# Patient Record
Sex: Female | Born: 1975 | Race: Black or African American | Hispanic: No | Marital: Single | Smoking: Former smoker
Health system: Southern US, Community
[De-identification: ages and names within clinical notes are randomized; demographics above are authoritative.]

## PROBLEM LIST (undated history)

## (undated) DIAGNOSIS — Z789 Other specified health status: Secondary | ICD-10-CM

## (undated) HISTORY — PX: TUBAL LIGATION: SHX77

---

## 1997-09-09 ENCOUNTER — Inpatient Hospital Stay (HOSPITAL_COMMUNITY): Admission: AD | Admit: 1997-09-09 | Discharge: 1997-09-13 | Payer: Self-pay | Admitting: Obstetrics & Gynecology

## 1998-09-14 ENCOUNTER — Emergency Department (HOSPITAL_COMMUNITY): Admission: EM | Admit: 1998-09-14 | Discharge: 1998-09-14 | Payer: Self-pay | Admitting: Emergency Medicine

## 1999-06-20 ENCOUNTER — Emergency Department (HOSPITAL_COMMUNITY): Admission: EM | Admit: 1999-06-20 | Discharge: 1999-06-20 | Payer: Self-pay | Admitting: Emergency Medicine

## 1999-09-05 ENCOUNTER — Encounter: Payer: Self-pay | Admitting: Emergency Medicine

## 1999-09-05 ENCOUNTER — Emergency Department (HOSPITAL_COMMUNITY): Admission: EM | Admit: 1999-09-05 | Discharge: 1999-09-05 | Payer: Self-pay | Admitting: Emergency Medicine

## 1999-10-18 ENCOUNTER — Ambulatory Visit (HOSPITAL_COMMUNITY): Admission: RE | Admit: 1999-10-18 | Discharge: 1999-10-18 | Payer: Self-pay | Admitting: Obstetrics & Gynecology

## 1999-12-29 ENCOUNTER — Inpatient Hospital Stay (HOSPITAL_COMMUNITY): Admission: AD | Admit: 1999-12-29 | Discharge: 1999-12-31 | Payer: Self-pay | Admitting: *Deleted

## 1999-12-29 ENCOUNTER — Encounter (INDEPENDENT_AMBULATORY_CARE_PROVIDER_SITE_OTHER): Payer: Self-pay

## 2000-08-10 ENCOUNTER — Emergency Department (HOSPITAL_COMMUNITY): Admission: EM | Admit: 2000-08-10 | Discharge: 2000-08-10 | Payer: Self-pay | Admitting: Internal Medicine

## 2001-05-31 ENCOUNTER — Emergency Department (HOSPITAL_COMMUNITY): Admission: EM | Admit: 2001-05-31 | Discharge: 2001-05-31 | Payer: Self-pay | Admitting: Emergency Medicine

## 2004-03-10 ENCOUNTER — Emergency Department (HOSPITAL_COMMUNITY): Admission: EM | Admit: 2004-03-10 | Discharge: 2004-03-10 | Payer: Self-pay | Admitting: Emergency Medicine

## 2004-12-24 ENCOUNTER — Emergency Department (HOSPITAL_COMMUNITY): Admission: EM | Admit: 2004-12-24 | Discharge: 2004-12-24 | Payer: Self-pay | Admitting: Emergency Medicine

## 2008-10-02 ENCOUNTER — Emergency Department (HOSPITAL_COMMUNITY): Admission: EM | Admit: 2008-10-02 | Discharge: 2008-10-02 | Payer: Self-pay | Admitting: Family Medicine

## 2010-11-16 NOTE — Op Note (Signed)
[facility]  Patient:    Krystal Nixon, Krystal Nixon                        MRN: [id number] Proc. Date: 12/30/99 Adm. Date:  [id number] Attending:  Michaelle Copas                           Operative Report  PREOPERATIVE DIAGNOSIS:           Desires sterilization.  POSTOPERATIVE DIAGNOSIS:          Desires sterilization.  PROCEDURE:                        Bilateral partial salpingectomy (Pomeroy technique).  SURGEON:                          Charles A. Clearance Coots, M.D.  ANESTHESIA:                       Spinal.  ESTIMATED BLOOD LOSS:             Negligible.  COMPLICATIONS:                    None.  SPECIMEN:                         Approximately 2 cm segments of right and left fallopian tubes.  DESCRIPTION OF OPERATION:         The patient was brought to the operating room, and after satisfactory spinal anesthesia the abdomen was prepped and draped in the usual sterile fashion.  A small inferior umbilical incision was made with the scalpel.  It was deepened down to the fascia with curved Mayo scissors.  The fascia was grasped in the midline with two Kelly forceps, cut transversely with curved Mayo scissors down through the peritoneum.  The incision was then extended to the left and then to right with curved Mayo scissors.  Right angle retractors were placed in the incision, and the right fallopian tube was identified and grasped with the Babcock clamp.  The tube was followed from the conial end to the fimbrial end serially, and then grasped with Babcock clamps.  The tube was then regrasped in the the isthmic area of the tube with the Babcock clamp, and a knuckle of tube beneath the Babcock clamp was doubly ligated with #1 plain cat gut.  The section of tube was excised with Metzenbaum scissors above the knot; submitted to pathology for examination.  There was no active bleeding from the tubal stumps; they were therefore placed back in the normal  anatomic position.                                    The same procedure was performed on the opposite side without complications.  The abdomen was then closed as follows.                                    The peritoneum and fascia was closed as one with a continuous suture of 2-0 Vicryl.  Skin was closed with continuous subcuticular suture of 4-0 Monocryl.  Sterile bandage was  applied to the incision closure.  The surgical technician indicated that all needles, sponge and instrument counts were correct.  The patient tolerated the procedure well and was transported to the recovery room in satisfactory condition. DD:  12/30/99 TD:  12/31/99 Job: [id number]

## 2011-04-23 ENCOUNTER — Emergency Department (HOSPITAL_COMMUNITY)
Admission: EM | Admit: 2011-04-23 | Discharge: 2011-04-23 | Disposition: A | Discharge status: Home / Self Care | Payer: Self-pay | Attending: Emergency Medicine | Admitting: Emergency Medicine

## 2011-04-23 DIAGNOSIS — M25559 Pain in unspecified hip: Secondary | ICD-10-CM | POA: Insufficient documentation

## 2011-04-23 DIAGNOSIS — M79609 Pain in unspecified limb: Secondary | ICD-10-CM | POA: Insufficient documentation

## 2011-04-23 DIAGNOSIS — IMO0001 Reserved for inherently not codable concepts without codable children: Secondary | ICD-10-CM | POA: Insufficient documentation

## 2011-04-23 DIAGNOSIS — M5430 Sciatica, unspecified side: Secondary | ICD-10-CM | POA: Insufficient documentation

## 2013-08-07 ENCOUNTER — Inpatient Hospital Stay (HOSPITAL_COMMUNITY)
Admission: AD | Admit: 2013-08-07 | Discharge: 2013-08-07 | Disposition: A | Discharge status: Home / Self Care | Payer: Self-pay | Source: Ambulatory Visit | Attending: Obstetrics & Gynecology | Admitting: Obstetrics & Gynecology

## 2013-08-07 ENCOUNTER — Encounter (HOSPITAL_COMMUNITY): Payer: Self-pay | Admitting: *Deleted

## 2013-08-07 DIAGNOSIS — A5901 Trichomonal vulvovaginitis: Secondary | ICD-10-CM

## 2013-08-07 LAB — WET PREP, GENITAL
Clue Cells Wet Prep HPF POC: NONE SEEN
Yeast Wet Prep HPF POC: NONE SEEN

## 2013-08-07 LAB — URINALYSIS, ROUTINE W REFLEX MICROSCOPIC
Bilirubin Urine: NEGATIVE
Glucose, UA: NEGATIVE mg/dL
Ketones, ur: NEGATIVE mg/dL
Nitrite: NEGATIVE
Protein, ur: NEGATIVE mg/dL
Specific Gravity, Urine: 1.025 (ref 1.005–1.030)
Urobilinogen, UA: 0.2 mg/dL (ref 0.0–1.0)
pH: 5.5 (ref 5.0–8.0)

## 2013-08-07 LAB — URINE MICROSCOPIC-ADD ON

## 2013-08-07 LAB — POCT PREGNANCY, URINE: Preg Test, Ur: NEGATIVE

## 2013-08-07 MED ORDER — METRONIDAZOLE 500 MG PO TABS
2000.0000 mg | ORAL_TABLET | Freq: Once | ORAL | Status: AC
  Administered 2013-08-07: 2000 mg via ORAL
  Filled 2013-08-07: qty 4

## 2013-08-07 NOTE — Discharge Instructions (Signed)
Trichomoniasis Trichomoniasis is an infection, caused by the Trichomonas organism, that affects both women and men. In women, the outer female genitalia and the vagina are affected. In men, the penis is mainly affected, but the prostate and other reproductive organs can also be involved. Trichomoniasis is a sexually transmitted disease (STD) and is most often passed to another person through sexual contact. The majority of people who get trichomoniasis do so from a sexual encounter and are also at risk for other STDs. CAUSES   Sexual intercourse with an infected partner.  It can be present in swimming pools or hot tubs. SYMPTOMS   Abnormal gray-green frothy vaginal discharge in women.  Vaginal itching and irritation in women.  Itching and irritation of the area outside the vagina in women.  Penile discharge with or without pain in males.  Inflammation of the urethra (urethritis), causing painful urination.  Bleeding after sexual intercourse. RELATED COMPLICATIONS  Pelvic inflammatory disease.  Infection of the uterus (endometritis).  Infertility.  Tubal (ectopic) pregnancy.  It can be associated with other STDs, including gonorrhea and chlamydia, hepatitis B, and HIV. COMPLICATIONS DURING PREGNANCY  Early (premature) delivery.  Premature rupture of the membranes (PROM).  Low birth weight. DIAGNOSIS   Visualization of Trichomonas under the microscope from the vagina discharge.  Ph of the vagina greater than 4.5, tested with a test tape.  Trich Rapid Test.  Culture of the organism, but this is not usually needed.  It may be found on a Pap test.  Having a "strawberry cervix,"which means the cervix looks very red like a strawberry. TREATMENT   You may be given medication to fight the infection. Inform your caregiver if you could be or are pregnant. Some medications used to treat the infection should not be taken during pregnancy.  Over-the-counter medications or  creams to decrease itching or irritation may be recommended. Your sexual partner will need to be treated HOME CARE INSTRUCTIONS   Take all medication prescribed by your caregiver.  Take over-the-counter medication for itching or irritation as directed by your caregiver.  Do not have sexual intercourse while you have the infection.  Do not douche or wear tampons.  Discuss your infection with your partner, as your partner may have acquired the infection from you. Or, your partner may have been the person who transmitted the infection to you.  Have your sex partner examined and treated if necessary.  Practice safe, informed, and protected sex.  See your caregiver for other STD testing. SEEK MEDICAL CARE IF:   You still have symptoms after you finish the medication.  You have an oral temperature above 102 F (38.9 C).  You develop belly (abdominal) pain.  You have pain when you urinate.  You have bleeding after sexual intercourse.  You develop a rash.  The medication makes you sick or makes you throw up (vomit). Document Released: 12/11/2000 Document Revised: 09/09/2011 Document Reviewed: 01/06/2009 [facility] Patient Information 2014 [employer], [place].

## 2013-08-07 NOTE — MAU Provider Note (Signed)
History     CSN: [id number]  Arrival date and time: 08/07/13 4098   First Provider Initiated Contact with Patient 08/07/13 1112      Chief Complaint  Patient presents with  . Vaginal Itching   HPI  Pt is 38 yo not pregnant with hx of tubal ligation.  Pt presents with vaginal discharge with odor.  Pt has hx of BV.  Pt has had sx for about 1 week. Pt is sexually active with one partner Rn note: Signed  MAU Note Service date: 08/07/2013 11:06 AM   Pt presents with complaints of vaginal discharge that she noticed earlier this week. Denies any bleeding at this time.    History reviewed. No pertinent past medical history.  Past Surgical History  Procedure Laterality Date  . Tubal ligation      History reviewed. No pertinent family history.  History  Substance Use Topics  . Smoking status: Never Smoker   . Smokeless tobacco: Not on file  . Alcohol Use: Yes    Allergies: No Known Allergies  No prescriptions prior to admission    Review of Systems  Constitutional: Negative for fever and chills.  Gastrointestinal: Negative for nausea, vomiting, abdominal pain, diarrhea and constipation.  Genitourinary: Negative for dysuria and urgency.   Physical Exam   Blood pressure 115/82, pulse 65, temperature 98.1 F (36.7 C), resp. rate 18, last menstrual period 08/01/2013.  Physical Exam  Vitals reviewed. Constitutional: She is oriented to person, place, and time. She appears well-developed and well-nourished. No distress.  HENT:  Head: Normocephalic.  Eyes: Pupils are equal, round, and reactive to light.  Neck: Normal range of motion. Neck supple.  Cardiovascular: Normal rate.   Respiratory: Effort normal.  GI: She exhibits no distension. There is no tenderness. There is no rebound.  Genitourinary:  Small amount of watery cream colored discharge in vault; cervic clean, NT; uterus NSSC NT; adnexa without palpable enlargement or tenderness  Musculoskeletal: Normal range  of motion.  Neurological: She is alert and oriented to person, place, and time.  Skin: Skin is warm and dry.  Psychiatric: She has a normal mood and affect.    MAU Course  Procedures Results for orders placed during the hospital encounter of 08/07/13 (from the past 24 hour(s))  URINALYSIS, ROUTINE W REFLEX MICROSCOPIC     Status: Abnormal   Collection Time    08/07/13 10:56 AM      Result Value Range   Color, Urine YELLOW  YELLOW   APPearance CLEAR  CLEAR   Specific Gravity, Urine 1.025  1.005 - 1.030   pH 5.5  5.0 - 8.0   Glucose, UA NEGATIVE  NEGATIVE mg/dL   Hgb urine dipstick TRACE (*) NEGATIVE   Bilirubin Urine NEGATIVE  NEGATIVE   Ketones, ur NEGATIVE  NEGATIVE mg/dL   Protein, ur NEGATIVE  NEGATIVE mg/dL   Urobilinogen, UA 0.2  0.0 - 1.0 mg/dL   Nitrite NEGATIVE  NEGATIVE   Leukocytes, UA SMALL (*) NEGATIVE  URINE MICROSCOPIC-ADD ON     Status: Abnormal   Collection Time    08/07/13 10:56 AM      Result Value Range   Squamous Epithelial / LPF FEW (*) RARE   WBC, UA 3-6  <3 WBC/hpf   RBC / HPF 0-2  <3 RBC/hpf   Bacteria, UA FEW (*) RARE  WET PREP, GENITAL     Status: Abnormal   Collection Time    08/07/13 11:18 AM      Result  Value Range   Yeast Wet Prep HPF POC NONE SEEN  NONE SEEN   Trich, Wet Prep FEW (*) NONE SEEN   Clue Cells Wet Prep HPF POC NONE SEEN  NONE SEEN   WBC, Wet Prep HPF POC MODERATE (*) NONE SEEN  POCT PREGNANCY, URINE     Status: None   Collection Time    08/07/13 11:28 AM      Result Value Range   Preg Test, Ur NEGATIVE  NEGATIVE  trich on wet prep- flagyl 2 gm given in MAU  Assessment and Plan  Trich vaginitis- flagyl 2 gm given in MAU  Sallyann Kinnaird 08/07/2013, 12:12 PM

## 2013-08-07 NOTE — MAU Note (Signed)
Pt presents with complaints of vaginal discharge that she noticed earlier this week. Denies any bleeding at this time.

## 2013-08-09 LAB — URINE CULTURE

## 2013-08-09 LAB — GC/CHLAMYDIA PROBE AMP
CT Probe RNA: NEGATIVE
GC Probe RNA: NEGATIVE

## 2013-08-10 ENCOUNTER — Telehealth (HOSPITAL_COMMUNITY): Payer: Self-pay | Admitting: Obstetrics and Gynecology

## 2013-08-10 MED ORDER — NITROFURANTOIN MONOHYD MACRO 100 MG PO CAPS: 100.0000 mg | ORAL_CAPSULE | Freq: Two times a day (BID) | ORAL | Status: DC

## 2013-08-10 NOTE — Telephone Encounter (Signed)
informed patient of positive UTI; macrobid sent to her pharmacy.

## 2014-05-02 ENCOUNTER — Encounter (HOSPITAL_COMMUNITY): Payer: Self-pay | Admitting: *Deleted

## 2014-09-24 ENCOUNTER — Encounter (HOSPITAL_COMMUNITY): Payer: Self-pay

## 2014-09-24 ENCOUNTER — Emergency Department (HOSPITAL_COMMUNITY)
Admission: EM | Admit: 2014-09-24 | Discharge: 2014-09-24 | Disposition: A | Discharge status: Home / Self Care | Payer: Self-pay | Attending: Emergency Medicine | Admitting: Emergency Medicine

## 2014-09-24 DIAGNOSIS — Z3202 Encounter for pregnancy test, result negative: Secondary | ICD-10-CM | POA: Insufficient documentation

## 2014-09-24 DIAGNOSIS — Z720 Tobacco use: Secondary | ICD-10-CM | POA: Insufficient documentation

## 2014-09-24 DIAGNOSIS — N898 Other specified noninflammatory disorders of vagina: Secondary | ICD-10-CM

## 2014-09-24 DIAGNOSIS — Z79899 Other long term (current) drug therapy: Secondary | ICD-10-CM | POA: Insufficient documentation

## 2014-09-24 DIAGNOSIS — Z9851 Tubal ligation status: Secondary | ICD-10-CM | POA: Insufficient documentation

## 2014-09-24 LAB — URINALYSIS, ROUTINE W REFLEX MICROSCOPIC
Bilirubin Urine: NEGATIVE
Glucose, UA: NEGATIVE mg/dL
Hgb urine dipstick: NEGATIVE
Ketones, ur: NEGATIVE mg/dL
Leukocytes, UA: NEGATIVE
Nitrite: NEGATIVE
Protein, ur: NEGATIVE mg/dL
Specific Gravity, Urine: 1.027 (ref 1.005–1.030)
Urobilinogen, UA: 0.2 mg/dL (ref 0.0–1.0)
pH: 5.5 (ref 5.0–8.0)

## 2014-09-24 LAB — WET PREP, GENITAL
Clue Cells Wet Prep HPF POC: NONE SEEN
Trich, Wet Prep: NONE SEEN
Yeast Wet Prep HPF POC: NONE SEEN

## 2014-09-24 LAB — PREGNANCY, URINE: Preg Test, Ur: NEGATIVE

## 2014-09-24 MED ORDER — AZITHROMYCIN 250 MG PO TABS
1000.0000 mg | ORAL_TABLET | Freq: Once | ORAL | Status: AC
  Administered 2014-09-24: 1000 mg via ORAL
  Filled 2014-09-24: qty 4

## 2014-09-24 MED ORDER — CEFTRIAXONE SODIUM 250 MG IJ SOLR
250.0000 mg | Freq: Once | INTRAMUSCULAR | Status: AC
  Administered 2014-09-24: 250 mg via INTRAMUSCULAR
  Filled 2014-09-24: qty 250

## 2014-09-24 NOTE — Discharge Instructions (Signed)
The samples we took today do not show signs of bacterial vaginosis, yeast infection or trichomonas. Gonorrhea and Chlamydia testing is being done, but these results will not be back today. If they come back positive we will get in contact with you. You were treated empirically for them both though on your visit today.   Emergency Department Resource Guide 1) Find a Doctor and Pay Out of Pocket Although you won't have to find out who is covered by your insurance plan, it is a good idea to ask around and get recommendations. You will then need to call the office and see if the doctor you have chosen will accept you as a new patient and what types of options they offer for patients who are self-pay. Some doctors offer discounts or will set up payment plans for their patients who do not have insurance, but you will need to ask so you aren't surprised when you get to your appointment.  2) Contact Your Local Health Department Not all health departments have doctors that can see patients for sick visits, but many do, so it is worth a call to see if yours does. If you don't know where your local health department is, you can check in your phone book. The CDC also has a tool to help you locate your state's health department, and many state websites also have listings of all of their local health departments.  3) Find a Walk-in Clinic If your illness is not likely to be very severe or complicated, you may want to try a walk in clinic. These are popping up all over the country in pharmacies, drugstores, and shopping centers. They're usually staffed by nurse practitioners or physician assistants that have been trained to treat common illnesses and complaints. They're usually fairly quick and inexpensive. However, if you have serious medical issues or chronic medical problems, these are probably not your best option.  No Primary Care Doctor: - Call Health Connect at  [phone] - they can help you locate a primary  care doctor that  accepts your insurance, provides certain services, etc. - Physician Referral Service- [phone]  Chronic Pain Problems: Organization         Address  Phone   Notes  [facility]  [phone] Patients need to be referred by their primary care doctor.   Medication Assistance: Organization         Address  Phone   Notes  [facility] Medication [facility] [address] [place], [place] [address] [phone] --Must be a resident of [facility] -- Must have NO insurance coverage whatsoever (no Medicaid/ Medicare, etc.) -- The pt. MUST have a primary care doctor that directs their care regularly and follows them in the community   MedAssist  [phone]   [employer]  [phone]    Agencies that provide inexpensive medical care: Organization         Address  Phone   Notes  [facility]  [phone]   [facility]    [phone]) [phone]   [facility] [address] [place], [place] [address] [phone]   [facility] [address], [place] [phone]) [phone]   [facility]anned Parenthood    [phone]   [facility]    [phone]) [phone]   [facility]  [address], [place] Phone:  [phone]) [phone], Fax:  [phone]) [phone]  Hours of Operation:  9 am - 6 pm, M-F.  Also accepts Medicaid/Medicare and self-pay.  [facility] for [place], [address], [place] Phone: [phone], Fax: [phone]. Hours of Operation:  8:30 am - 5:30 pm, M-F.  Also accepts Medicaid and self-pay.  [facility] [place] [address] [place] Phone: [phone]   [place], [place], [place] [phone], Ext. [phone] Mondays & Thursdays: 7-9 AM.  First 15 patients are seen on a first come, first serve basis.    [place]  Providers:  Organization         Address  Phone   Notes  [facility] [address], [place] [phone] Also accepts self-pay patients.  [facility] [address], [place]  [phone]   [place], [address], [place] [phone]   [facility] [address] [place] [phone]   Lucianne Lei [address], [place]   [phone] Only accepts [place] Access [place] patients after they have their name applied to their card.   Self-Pay (no insurance) in [facility]:  Organization         Address  Phone   Notes  Sickle Cell Patients, [facility] [place] [phone]   [facility] [place] [phone]   [facility] [place]  [place], [place], [place] [phone]   [facility]/Dr. Osei-Bonsu  [address] [place] or [address], [place] [phone] Phone number for both [place] and [place] locations is the same.  [facility]gent [facility] [address] [place] [phone]   [facility] [address] [place] or [address] [phone]   [facility] [address], [place] [phone], phone; [phone], fax Sees patients 1st and 3rd Saturday of every month.  Must not qualify for public or private insurance (i.e. Medicaid, Medicare, [place] Health Choice, Veterans' Benefits)  Household income should be no more than 200% of the poverty level The clinic cannot treat you if you are pregnant or think you are pregnant  Sexually transmitted diseases are not treated at the clinic.    Dental Care: Organization         Address  Phone  Notes  [facility] [place] [phone] Accepts children up to age 74 who are enrolled in [place] or [place]; pregnant women with a Medicaid card; and children who have applied for Medicaid or [place] Health Choice, but were declined, whose parents can pay a reduced fee at time of service.  [facility]  [address], [place] [phone] Accepts children up to age 32 who are enrolled in [place] or [place]; pregnant women with a Medicaid card; and children who have applied for Medicaid or [place] Health Choice, but were declined, whose parents can pay a reduced fee at time of service.  [facility] PRO[facility]  [place] [phone] Patients are seen by appointment only. Walk-ins are not accepted. [place] will  see patients 5 years of age and older. Monday - Tuesday (8am-5pm) Most Wednesdays (8:30-5pm) $30 per visit, cash only  [facility] Access PRO[facility]  [address], [facility] [phone] Patients are seen by appointment only. Walk-ins are not accepted. [place] will see patients 73 years of age and older. One Wednesday Evening (Monthly: Volunteer Based).  $30 per visit, cash only  [place]  [phone] for adults; Children under age 58, call Graduate Pediatric Dentistry at [phone]. Children aged 68-14, please call [phone] to request a pediatric application.  Dental services are provided in all areas of dental care including fillings, crowns and bridges, complete and partial dentures, implants, gum treatment, root canals, and extractions. Preventive care is also provided. Treatment is provided to both adults and children. Patients are selected via a lottery and there is often a waiting list.   [facility] [address] [place]  [phone] [email or web address]   [facility]  [address] [place], [place] [phone], Ext. [phone] Second and Fourth Thursday of each month, opens at 6:30 AM; Clinic ends at 9 AM.  Patients are seen on a first-come first-served basis, and a limited number are seen during each clinic.   [facility]  [address] Hillard Danker [place], [place] [phone]   Eligibility Requirements You must have lived in [place], [place], or [place] counties for at least the last three months.   You cannot be eligible for state or federal sponsored [employer], including [employer], [place], or [employer].   You generally cannot be eligible for healthcare insurance through your employer.    How to apply: Eligibility screenings are held every Tuesday and Wednesday afternoon from 1:00 pm until 4:00 pm. You do not need an appointment for the interview!  [facility] [address] [place], [place]   [place]  [place] Department  [place]  [phone]    Behavioral Health Resources in the Community: Intensive Outpatient Programs Organization         Address  Phone  Notes  [place]. [address] [place], [place] [phone]   [facility] Outpatient [address] [place], [place]   ADS: Alcohol & Drug Svcs [address] [place], [place]   [place] [address]  [place], [place] or [phone]   Substance Abuse Resources Organization         Address  Phone  Notes  Alcohol and Drug Services  [phone]   [place]  [phone]   [facility] [place]   Chinita Pester  [phone]   Residential & Outpatient Substance Abuse Program  [phone]   Psychological Services Organization         Address  Phone  Notes  [facility]  [place]  [phone]   [place] [address] [place] or [phone]    Mobile Crisis Teams Organization         Address  Phone  Notes  [facility]atives, Mobile Crisis Care Unit  [phone]   [facility]ive Psychotherapeutic Services  [address] [place], [place]   [facility] [address] [place] [phone]    Self-Help/Support Groups Organization  Address  Phone             Notes  [place]. of [place] - variety of support groups  [place] Call for more information  Narcotics Anonymous (NA), [facility] [address], [employer] [place]  2 meetings at this location   Special educational needs teacher         Address  Phone  Notes  ASAP Residential Treatment [place],    [place]  [phone]   [facility]  [address], [place] [address]9924, [place], [place]   [place], [place] [phone] Admissions: 8am-3pm M-F  Incentives Substance [place] [address],    [place], [place] [phone]   [facility] [address] [place], [place], [place]   [facility] [address],  [place], [place]   [facility] Programs - Intensive Outpatient [address] Kristeen Mans [address], [place], [place]   [facility] ([place] [address]  [place], [place] [phone] or [phone]   [facility]idential Treatment Services (RTS) [address] [place], [place] Accepts Medicaid  [facility] [place] [address]  [place] [phone] Substance Abuse/Addiction Treatment   [facility] Organization         Address  Phone  Notes  [facility]  [phone]   Domenic Schwab, PhD [address] Arlis Porta [place], [place]   [phone] or [phone]    [place], [place] [phone]   [facility] [address], [place], [place] [phone] Insurance/Medicaid/sponsorship through [facility] and Families [address]                                    [place], [place] [phone] [place] [address], [place] [phone]    Dr. Adele Schilder  [phone]   [facility] Dept. 1) [address], [place] 2) [place] 3)  [place] [address], [place] [phone]  [phone]   [place] [phone] or [phone] (After Hours)

## 2014-09-24 NOTE — ED Provider Notes (Signed)
CSN: [id number]     Arrival date & time 09/24/14  2052 History   First MD Initiated Contact with Patient 09/24/14 2059     Chief Complaint  Patient presents with  . Vaginal Discharge     (Consider location/radiation/quality/duration/timing/severity/associated sxs/prior Treatment) HPI   38yF with vagina discharge. Onset three days ago. No other complaints. Denies pain. No smell. No urinary complaints. No bleeding. No fever or chills. Is sexually active. No n/v.   History reviewed. No pertinent past medical history. Past Surgical History  Procedure Laterality Date  . Tubal ligation     No family history on file. History  Substance Use Topics  . Smoking status: Current Every Day Smoker  . Smokeless tobacco: Not on file  . Alcohol Use: Yes     Comment: occasionally    OB History    Gravida Para Term Preterm AB TAB SAB Ectopic Multiple Living   3 3 2 1  0 0 0 0 0 3     Review of Systems  All systems reviewed and negative, other than as noted in HPI.    Allergies  Review of patient's allergies indicates no known allergies.  Home Medications   Prior to Admission medications   Medication Sig Start Date End Date Taking? Authorizing Provider  nitrofurantoin, macrocrystal-monohydrate, (MACROBID) 100 MG capsule Take 1 capsule (100 mg total) by mouth 2 (two) times daily. 08/10/13   Harolyn RutherfordJennifer I Rasch, NP   BP 146/96 mmHg  Pulse 110  Temp(Src) 98.2 F (36.8 C) (Oral)  Resp 14  SpO2 100%  LMP 09/03/2014 Physical Exam  Constitutional: She appears well-developed and well-nourished. No distress.  HENT:  Head: Normocephalic and atraumatic.  Eyes: Conjunctivae are normal. Right eye exhibits no discharge. Left eye exhibits no discharge.  Neck: Neck supple.  Cardiovascular: Normal rate, regular rhythm and normal heart sounds.  Exam reveals no gallop and no friction rub.   No murmur heard. Pulmonary/Chest: Effort normal and breath sounds normal. No respiratory distress.   Abdominal: Soft. She exhibits no distension. There is no tenderness.  Genitourinary:  Chaperone present. Normal external female genitalia. Moderate thin whitish vaginal discharge.   Musculoskeletal: She exhibits no edema or tenderness.  Neurological: She is alert.  Skin: Skin is warm and dry.  Psychiatric: She has a normal mood and affect. Her behavior is normal. Thought content normal.  Nursing note and vitals reviewed.   ED Course  Procedures (including critical care time) Labs Review Labs Reviewed  WET PREP, GENITAL - Abnormal; Notable for the following:    WBC, Wet Prep HPF POC RARE (*)    All other components within normal limits  URINALYSIS, ROUTINE W REFLEX MICROSCOPIC - Abnormal; Notable for the following:    Color, Urine AMBER (*)    APPearance CLOUDY (*)    All other components within normal limits  PREGNANCY, URINE  GC/CHLAMYDIA PROBE AMP ([place])    Imaging Review No results found.   EKG Interpretation None      MDM   Final diagnoses:  Vaginal discharge    38yF with vaginal discharge. No other complaints. Requesting empiric GC tx.     Raeford RazorStephen Shirl Ludington, MD 09/28/14 1444

## 2014-09-24 NOTE — ED Notes (Signed)
Pt presents with c/o vaginal discharge that started 3 days ago. Pt denies any vaginal bleeding or abdominal pain. Reports the discharge is white in color, no odor.

## 2014-09-26 LAB — GC/CHLAMYDIA PROBE AMP (~~LOC~~) NOT AT ARMC
Chlamydia: NEGATIVE
Neisseria Gonorrhea: NEGATIVE

## 2014-12-31 ENCOUNTER — Inpatient Hospital Stay (HOSPITAL_COMMUNITY)
Admission: AD | Admit: 2014-12-31 | Discharge: 2014-12-31 | Disposition: A | Discharge status: Home / Self Care | Payer: Self-pay | Source: Ambulatory Visit | Attending: Obstetrics & Gynecology | Admitting: Obstetrics & Gynecology

## 2014-12-31 ENCOUNTER — Encounter (HOSPITAL_COMMUNITY): Payer: Self-pay | Admitting: *Deleted

## 2014-12-31 DIAGNOSIS — B9689 Other specified bacterial agents as the cause of diseases classified elsewhere: Secondary | ICD-10-CM | POA: Insufficient documentation

## 2014-12-31 DIAGNOSIS — F17210 Nicotine dependence, cigarettes, uncomplicated: Secondary | ICD-10-CM | POA: Insufficient documentation

## 2014-12-31 DIAGNOSIS — A499 Bacterial infection, unspecified: Secondary | ICD-10-CM

## 2014-12-31 DIAGNOSIS — N760 Acute vaginitis: Secondary | ICD-10-CM | POA: Insufficient documentation

## 2014-12-31 LAB — URINALYSIS, ROUTINE W REFLEX MICROSCOPIC
BILIRUBIN URINE: NEGATIVE
Glucose, UA: NEGATIVE mg/dL
HGB URINE DIPSTICK: NEGATIVE
Ketones, ur: NEGATIVE mg/dL
LEUKOCYTES UA: NEGATIVE
NITRITE: NEGATIVE
PROTEIN: NEGATIVE mg/dL
Specific Gravity, Urine: >1.03 — ABNORMAL HIGH (ref 1.005–1.030)
Urobilinogen, UA: 0.2 mg/dL (ref 0.0–1.0)
pH: 5.5 (ref 5.0–8.0)

## 2014-12-31 LAB — WET PREP, GENITAL
Trich, Wet Prep: NONE SEEN
Yeast Wet Prep HPF POC: NONE SEEN

## 2014-12-31 LAB — POCT PREGNANCY, URINE: Preg Test, Ur: NEGATIVE

## 2014-12-31 MED ORDER — METRONIDAZOLE 500 MG PO TABS: 500.0000 mg | ORAL_TABLET | Freq: Two times a day (BID) | ORAL | Status: DC

## 2014-12-31 NOTE — MAU Provider Note (Signed)
History     CSN: [id number]  Arrival date and time: 12/31/14 1357   First Provider Initiated Contact with Patient 12/31/14 1447      Chief Complaint  Patient presents with  . Urinary Tract Infection   HPI 39 y.o. [name] here w/ thin vaginal discharge and odor x months and dysuria for a few days. Pt [facility], was treated presumptively for GC/CT, then developed thick, white discharge. Treated with OTC monistat, now discharge is thin and white w/ odor.   History reviewed. No pertinent past medical history.  Past Surgical History  Procedure Laterality Date  . Tubal ligation      History reviewed. No pertinent family history.  History  Substance Use Topics  . Smoking status: Current Every Day Smoker  . Smokeless tobacco: Not on file  . Alcohol Use: Yes     Comment: occasionally     Allergies: No Known Allergies  No prescriptions prior to admission    Review of Systems  Constitutional: Negative.   Respiratory: Negative.   Cardiovascular: Negative.   Gastrointestinal: Negative for nausea, vomiting, abdominal pain, diarrhea and constipation.  Genitourinary: Negative for dysuria, urgency, frequency, hematuria and flank pain.       Negative for vaginal bleeding, +vaginal discharge  Musculoskeletal: Negative.   Neurological: Negative.   Psychiatric/Behavioral: Negative.    Physical Exam   Blood pressure 132/92, pulse 107, temperature 98.2 F (36.8 C), temperature source Oral, resp. rate 18, height 5\' 8"  (1.727 m), weight 184 lb (83.462 kg), last menstrual period 12/25/2014.  Physical Exam  Nursing note and vitals reviewed. Constitutional: She is oriented to person, place, and time. She appears well-developed and well-nourished. No distress.  Cardiovascular: Normal rate.   Respiratory: Effort normal.  GI: Soft. There is no tenderness.  Genitourinary: Vaginal discharge (thin, white, odor) found.  Musculoskeletal:  Normal range of motion.  Neurological: She is alert and oriented to person, place, and time.  Skin: Skin is warm and dry.  Psychiatric: She has a normal mood and affect.    MAU Course  Procedures Results for orders placed or performed during the hospital encounter of 12/31/14 (from the past 24 hour(s))  Urinalysis, Routine w reflex microscopic (not [facility])     Status: Abnormal   Collection Time: 12/31/14  2:16 PM  Result Value Ref Range   Color, Urine YELLOW YELLOW   APPearance CLEAR CLEAR   Specific Gravity, Urine >1.030 (H) 1.005 - 1.030   pH 5.5 5.0 - 8.0   Glucose, UA NEGATIVE NEGATIVE mg/dL   Hgb urine dipstick NEGATIVE NEGATIVE   Bilirubin Urine NEGATIVE NEGATIVE   Ketones, ur NEGATIVE NEGATIVE mg/dL   Protein, ur NEGATIVE NEGATIVE mg/dL   Urobilinogen, UA 0.2 0.0 - 1.0 mg/dL   Nitrite NEGATIVE NEGATIVE   Leukocytes, UA NEGATIVE NEGATIVE  Pregnancy, urine POC     Status: None   Collection Time: 12/31/14  2:32 PM  Result Value Ref Range   Preg Test, Ur NEGATIVE NEGATIVE  Wet prep, genital     Status: Abnormal   Collection Time: 12/31/14  2:50 PM  Result Value Ref Range   Yeast Wet Prep HPF POC NONE SEEN NONE SEEN   Trich, Wet Prep NONE SEEN NONE SEEN   Clue Cells Wet Prep HPF POC MODERATE (A) NONE SEEN   WBC, Wet Prep HPF POC FEW (A) NONE SEEN     Assessment and Plan   1. BV (bacterial vaginosis)  Treat w/ flagyl, GC/CT and HIV pending.     Medication List    STOP taking these medications        nitrofurantoin (macrocrystal-monohydrate) 100 MG capsule  Commonly known as:  MACROBID      TAKE these medications        metroNIDAZOLE 500 MG tablet  Commonly known as:  FLAGYL  Take 1 tablet (500 mg total) by mouth 2 (two) times daily.            Follow-up Information    Follow up with [facility].   Specialty:  Obstetrics and Gynecology   Why:  As needed   Contact information:   [address] [place]  [address] [phone]        Krystal Nixon 12/31/2014, 3:47 PM

## 2014-12-31 NOTE — Discharge Instructions (Signed)
Bacterial Vaginosis Bacterial vaginosis is a vaginal infection that occurs when the normal balance of bacteria in the vagina is disrupted. It results from an overgrowth of certain bacteria. This is the most common vaginal infection in women of childbearing age. Treatment is important to prevent complications, especially in pregnant women, as it can cause a premature delivery. CAUSES  Bacterial vaginosis is caused by an increase in harmful bacteria that are normally present in smaller amounts in the vagina. Several different kinds of bacteria can cause bacterial vaginosis. However, the reason that the condition develops is not fully understood. RISK FACTORS Certain activities or behaviors can put you at an increased risk of developing bacterial vaginosis, including:  Having a new sex partner or multiple sex partners.  Douching.  Using an intrauterine device (IUD) for contraception. Women do not get bacterial vaginosis from toilet seats, bedding, swimming pools, or contact with objects around them. SIGNS AND SYMPTOMS  Some women with bacterial vaginosis have no signs or symptoms. Common symptoms include:  Grey vaginal discharge.  A fishlike odor with discharge, especially after sexual intercourse.  Itching or burning of the vagina and vulva.  Burning or pain with urination. DIAGNOSIS  Your health care provider will take a medical history and examine the vagina for signs of bacterial vaginosis. A sample of vaginal fluid may be taken. Your health care provider will look at this sample under a microscope to check for bacteria and abnormal cells. A vaginal pH test may also be done.  TREATMENT  Bacterial vaginosis may be treated with antibiotic medicines. These may be given in the form of a pill or a vaginal cream. A second round of antibiotics may be prescribed if the condition comes back after treatment.  HOME CARE INSTRUCTIONS   Only take over-the-counter or prescription medicines as  directed by your health care provider.  If antibiotic medicine was prescribed, take it as directed. Make sure you finish it even if you start to feel better.  Do not have sex until treatment is completed.  Tell all sexual partners that you have a vaginal infection. They should see their health care provider and be treated if they have problems, such as a mild rash or itching.  Practice safe sex by using condoms and only having one sex partner. SEEK MEDICAL CARE IF:   Your symptoms are not improving after 3 days of treatment.  You have increased discharge or pain.  You have a fever. MAKE SURE YOU:   Understand these instructions.  Will watch your condition.  Will get help right away if you are not doing well or get worse. FOR MORE INFORMATION  Centers for Disease Control and Prevention, Division of STD Prevention: www.cdc.gov/std American Sexual Health Association (ASHA): www.ashastd.org  Document Released: 06/17/2005 Document Revised: 04/07/2013 Document Reviewed: 01/27/2013 ExitCare Patient Information 2015 [employer]. This information is not intended to replace advice given to you by your health care provider. Make sure you discuss any questions you have with your health care provider.  

## 2014-12-31 NOTE — MAU Note (Addendum)
Pt states urgency and burning when voiding for past week. Was taking AZO tablets while on menstrual cycle. Was having light discharge [facility] was treated empirically for gonorrhea and chlamydia. Now has yeast infection. Has tried all otc meds with no relief.

## 2015-01-01 LAB — HIV ANTIBODY (ROUTINE TESTING W REFLEX): HIV Screen 4th Generation wRfx: NONREACTIVE

## 2015-01-03 LAB — GC/CHLAMYDIA PROBE AMP (~~LOC~~) NOT AT ARMC
Chlamydia: NEGATIVE
NEISSERIA GONORRHEA: POSITIVE — AB

## 2015-01-05 ENCOUNTER — Ambulatory Visit (INDEPENDENT_AMBULATORY_CARE_PROVIDER_SITE_OTHER): Payer: Self-pay | Admitting: *Deleted

## 2015-01-05 DIAGNOSIS — Z202 Contact with and (suspected) exposure to infections with a predominantly sexual mode of transmission: Secondary | ICD-10-CM

## 2015-01-05 DIAGNOSIS — A549 Gonococcal infection, unspecified: Secondary | ICD-10-CM

## 2015-01-05 MED ORDER — AZITHROMYCIN 250 MG PO TABS
1000.0000 mg | ORAL_TABLET | Freq: Once | ORAL | Status: AC
  Administered 2015-01-05: 1000 mg via ORAL

## 2015-01-05 MED ORDER — CEFTRIAXONE SODIUM 1 G IJ SOLR
250.0000 mg | Freq: Once | INTRAMUSCULAR | Status: AC
  Administered 2015-01-05: 250 mg via INTRAMUSCULAR

## 2015-01-05 NOTE — Progress Notes (Signed)
Pt in clinic for STD treatment, pt positive for Gonorrhea.  Treated with Rocephin 1gm and azithromycin and 1gm.  Pt verbalizes understanding that she is to remain abstinent for seven days and to have partner/partners treated.

## 2015-02-03 ENCOUNTER — Inpatient Hospital Stay (HOSPITAL_COMMUNITY)
Admission: AD | Admit: 2015-02-03 | Discharge: 2015-02-03 | Disposition: A | Discharge status: Home / Self Care | Payer: Self-pay | Source: Ambulatory Visit | Attending: Obstetrics & Gynecology | Admitting: Obstetrics & Gynecology

## 2015-02-03 ENCOUNTER — Encounter (HOSPITAL_COMMUNITY): Payer: Self-pay | Admitting: *Deleted

## 2015-02-03 DIAGNOSIS — B373 Candidiasis of vulva and vagina: Secondary | ICD-10-CM | POA: Insufficient documentation

## 2015-02-03 DIAGNOSIS — F17200 Nicotine dependence, unspecified, uncomplicated: Secondary | ICD-10-CM | POA: Insufficient documentation

## 2015-02-03 DIAGNOSIS — B379 Candidiasis, unspecified: Secondary | ICD-10-CM

## 2015-02-03 HISTORY — DX: Other specified health status: Z78.9

## 2015-02-03 LAB — URINALYSIS, ROUTINE W REFLEX MICROSCOPIC
BILIRUBIN URINE: NEGATIVE
GLUCOSE, UA: NEGATIVE mg/dL
HGB URINE DIPSTICK: NEGATIVE
Ketones, ur: NEGATIVE mg/dL
Leukocytes, UA: NEGATIVE
Nitrite: NEGATIVE
PH: 7 (ref 5.0–8.0)
PROTEIN: NEGATIVE mg/dL
Specific Gravity, Urine: 1.02 (ref 1.005–1.030)
UROBILINOGEN UA: 0.2 mg/dL (ref 0.0–1.0)

## 2015-02-03 LAB — WET PREP, GENITAL
TRICH WET PREP: NONE SEEN
WBC, Wet Prep HPF POC: NONE SEEN
Yeast Wet Prep HPF POC: NONE SEEN

## 2015-02-03 LAB — POCT PREGNANCY, URINE: Preg Test, Ur: NEGATIVE

## 2015-02-03 MED ORDER — FLUCONAZOLE 150 MG PO TABS: 150.0000 mg | ORAL_TABLET | Freq: Every day | ORAL | Status: DC

## 2015-02-03 NOTE — Discharge Instructions (Signed)
There is no BV today. [facility]. Get your medicine at your pharmacy. The one pill works for 7 days. Cultures are pending and we will call you if you need additional treatment.

## 2015-02-03 NOTE — MAU Provider Note (Signed)
History     CSN: [id number]  Arrival date and time: 02/03/15 1713  Seen by provider at 1740    Chief Complaint  Patient presents with  . Vaginal Discharge   HPI Krystal Krystal 39 y.o. Comes to MAU to have white vaginal discharge checked.  Does not have a doctor.  Has had this discharge for a week and itching noted today.  Thinks she has BV.  Was treated for gonorrhea in July, but does not think she has an STI.  Uses tubal ligation for contraception.  Has not used any treatment at home.  OB History    Gravida Para Term Preterm AB TAB SAB Ectopic Multiple Living   3 3 2 1  0 0 0 0 0 3      Past Medical History  Diagnosis Date  . Medical history non-contributory     Past Surgical History  Procedure Laterality Date  . Tubal ligation      History reviewed. No pertinent family history.  History  Substance Use Topics  . Smoking status: Current Every Day Smoker  . Smokeless tobacco: Not on file  . Alcohol Use: Yes     Comment: occasionally     Allergies: No Known Allergies  Prescriptions prior to admission  Medication Sig Dispense Refill Last Dose  . metroNIDAZOLE (FLAGYL) 500 MG tablet Take 1 tablet (500 mg total) by mouth 2 (two) times daily. (Patient not taking: Reported on 02/03/2015) 14 tablet 0 Completed Course at Unknown time    Review of Systems  Constitutional: Negative for fever.  Gastrointestinal: Negative for nausea, vomiting and abdominal pain.  Genitourinary:       Vaginal discharge. No vaginal bleeding. No dysuria.  Neurological: Negative for headaches.   Physical Exam   Blood pressure 141/88, pulse 78, temperature 98.3 F (36.8 C), temperature source Oral, resp. rate 16, height 5\' 8"  (1.727 m), weight 178 lb (80.74 kg), last menstrual period 01/23/2015.  Physical Exam  Nursing note and vitals reviewed. Constitutional: She is oriented to person, place, and time. She appears well-developed and well-nourished.  HENT:  Head: Normocephalic.  Eyes:  EOM are normal.  Neck: Neck supple.  GI: Soft. There is no tenderness.  Genitourinary:  Speculum exam: Vagina - Small amount of adherent, grainy discharge only seen internally, no odor, no white discharge at the introitus Cervix - No contact bleeding Bimanual exam: Cervix closed Uterus non tender, normal size Adnexa non tender, no masses bilaterally GC/Chlam, wet prep done Chaperone present for exam.  Musculoskeletal: Normal range of motion.  Neurological: She is alert and oriented to person, place, and time.  Skin: Skin is warm and dry.  Psychiatric: She has a normal mood and affect.    MAU Course  Procedures  MDM Results for orders placed or performed during the hospital encounter of 02/03/15 (from the past 24 hour(s))  Urinalysis, Routine w reflex microscopic (not at [facility])     Status: Abnormal   Collection Time: 02/03/15  5:24 PM  Result Value Ref Range   Color, Urine YELLOW YELLOW   APPearance CLOUDY (A) CLEAR   Specific Gravity, Urine 1.020 1.005 - 1.030   pH 7.0 5.0 - 8.0   Glucose, UA NEGATIVE NEGATIVE mg/dL   Hgb urine dipstick NEGATIVE NEGATIVE   Bilirubin Urine NEGATIVE NEGATIVE   Ketones, ur NEGATIVE NEGATIVE mg/dL   Protein, ur NEGATIVE NEGATIVE mg/dL   Urobilinogen, UA 0.2 0.0 - 1.0 mg/dL   Nitrite NEGATIVE NEGATIVE   Leukocytes, UA NEGATIVE NEGATIVE  Pregnancy, urine POC     Status: None   Collection Time: 02/03/15  5:51 PM  Result Value Ref Range   Preg Test, Ur NEGATIVE NEGATIVE  Wet prep, genital     Status: Abnormal   Collection Time: 02/03/15  5:54 PM  Result Value Ref Range   Yeast Wet Prep HPF POC NONE SEEN NONE SEEN   Trich, Wet Prep NONE SEEN NONE SEEN   Clue Cells Wet Prep HPF POC FEW (A) NONE SEEN   WBC, Wet Prep HPF POC NONE SEEN NONE SEEN    Assessment and Plan  Clinical yeast infection Borderline BP  Plan Advised to have BP checked regularly - borderline at present.  Her sister has severe hypertension.  Advised exercise and  nutritional diet. Will send a Diflucan to her pharmacy There is no clinical picture of BV so will not treat for BV today. Advised [facility] in the [facility]D clinic for more affordable care - today's visit is not an emergency. Cultures pending.  HIV done one month ago.  [name] All 02/03/2015, 6:03 PM

## 2015-02-03 NOTE — MAU Note (Signed)
Patient presents stating that she is not pregnant with c/o a vaginal discharge X 1 week. Denies pain or bleeding.

## 2015-02-06 LAB — GC/CHLAMYDIA PROBE AMP (~~LOC~~) NOT AT ARMC
Chlamydia: NEGATIVE
Neisseria Gonorrhea: NEGATIVE

## 2016-06-05 ENCOUNTER — Emergency Department (HOSPITAL_COMMUNITY): Payer: Self-pay

## 2016-06-05 ENCOUNTER — Emergency Department (HOSPITAL_COMMUNITY)
Admission: EM | Admit: 2016-06-05 | Discharge: 2016-06-05 | Disposition: A | Discharge status: Home / Self Care | Payer: Self-pay | Attending: Emergency Medicine | Admitting: Emergency Medicine

## 2016-06-05 ENCOUNTER — Encounter (HOSPITAL_COMMUNITY): Payer: Self-pay | Admitting: Emergency Medicine

## 2016-06-05 DIAGNOSIS — Z79899 Other long term (current) drug therapy: Secondary | ICD-10-CM | POA: Insufficient documentation

## 2016-06-05 DIAGNOSIS — B349 Viral infection, unspecified: Secondary | ICD-10-CM | POA: Insufficient documentation

## 2016-06-05 DIAGNOSIS — R059 Cough, unspecified: Secondary | ICD-10-CM

## 2016-06-05 DIAGNOSIS — R05 Cough: Secondary | ICD-10-CM

## 2016-06-05 DIAGNOSIS — J069 Acute upper respiratory infection, unspecified: Secondary | ICD-10-CM | POA: Insufficient documentation

## 2016-06-05 DIAGNOSIS — F17200 Nicotine dependence, unspecified, uncomplicated: Secondary | ICD-10-CM | POA: Insufficient documentation

## 2016-06-05 LAB — RAPID STREP SCREEN (MED CTR MEBANE ONLY): Streptococcus, Group A Screen (Direct): NEGATIVE

## 2016-06-05 MED ORDER — PROMETHAZINE-DM 6.25-15 MG/5ML PO SYRP: 5.0000 mL | ORAL_SOLUTION | Freq: Four times a day (QID) | ORAL | 0 refills | Status: DC | PRN

## 2016-06-05 MED ORDER — IBUPROFEN 600 MG PO TABS: 600.0000 mg | ORAL_TABLET | Freq: Four times a day (QID) | ORAL | 0 refills | Status: DC | PRN

## 2016-06-05 MED ORDER — FLUTICASONE PROPIONATE 50 MCG/ACT NA SUSP: 2.0000 | Freq: Every day | NASAL | 0 refills | Status: DC

## 2016-06-05 NOTE — ED Triage Notes (Signed)
Per pt, states cold symptoms, cough and sore throat for a few days-cough for 2 weeks

## 2016-06-05 NOTE — ED Provider Notes (Signed)
[facility]-EMERGENCY DEPT Provider Note   CSN: [id number] Arrival date & time: 06/05/16  1232  By signing my name below, I, Sonum Patel, attest that this documentation has been prepared under the direction and in the presence of Jc Veron, [place]-C. Electronically Signed: Sonum Patel, Neurosurgeoncribe. 06/05/16. 2:36 PM.  History   Chief Complaint Chief Complaint  Patient presents with  . URI    The history is provided by the patient. No language interpreter was used.    HPI Comments: Krystal Nixon is a 40 y.o. female who presents to the Emergency Department complaining of a persistent cough for the past 1 month which was initially getting better, and now worsening. It is now productive. She reports associated sore throat, chills, nasal congestion, rhinorrhea, subjective fever, and 1 episode of vomiting. She has tried OTC medications without significant relief. She denies history of asthma. She denies rash. Denies chest pain or SOB.   Past Medical History:  Diagnosis Date  . Medical history non-contributory     There are no active problems to display for this patient.   Past Surgical History:  Procedure Laterality Date  . TUBAL LIGATION      OB History    Gravida Para Term Preterm AB Living   3 3 2 1  0 3   SAB TAB Ectopic Multiple Live Births   0 0 0 0         Home Medications    Prior to Admission medications   Medication Sig Start Date End Date Taking? Authorizing Provider  fluconazole (DIFLUCAN) 150 MG tablet Take 1 tablet (150 mg total) by mouth daily. 02/03/15   Currie Pariserri L Burleson, NP    Family History No family history on file.  Social History Social History  Substance Use Topics  . Smoking status: Current Every Day Smoker  . Smokeless tobacco: Not on file  . Alcohol use Yes     Comment: occasionally      Allergies   Patient has no known allergies.   Review of Systems Review of Systems  Constitutional: Positive for chills and fever (subjective).  HENT: Positive for  congestion and rhinorrhea.   Respiratory: Positive for cough.   Gastrointestinal: Positive for vomiting.  Skin: Negative for rash.  All other systems reviewed and are negative.    Physical Exam Updated Vital Signs BP 136/93   Pulse 101   Temp 98.3 F (36.8 C)   Resp 20   SpO2 99%   Physical Exam  Constitutional: She is oriented to person, place, and time. She appears well-developed and well-nourished.  HENT:  Head: Normocephalic and atraumatic.  Right Ear: External ear normal.  Left Ear: External ear normal.  Mouth/Throat: Oropharynx is clear and moist.  TM NL bilaterally  Eyes: Conjunctivae and EOM are normal. Pupils are equal, round, and reactive to light.  Neck: Normal range of motion. Neck supple.  No meningismus  Cardiovascular: Normal rate, regular rhythm, normal heart sounds and intact distal pulses.   HR 101 in triage. HR 84 on my exam.  Pulmonary/Chest: Effort normal and breath sounds normal. No respiratory distress. She has no wheezes. She has no rales.  Abdominal: Soft. She exhibits no distension. There is no tenderness. There is no guarding.  No CVA tenderness  Musculoskeletal: Normal range of motion.  Lymphadenopathy:    She has no cervical adenopathy.  Neurological: She is alert and oriented to person, place, and time.  Skin: Skin is warm and dry. No rash noted.  Psychiatric: She  has a normal mood and affect.  Nursing note and vitals reviewed.    ED Treatments / Results  DIAGNOSTIC STUDIES: Oxygen Saturation is 99% on RA, normal by my interpretation.    COORDINATION OF CARE: 2:36 PM Discussed treatment plan with pt at bedside and pt agreed to plan.   Labs (all labs ordered are listed, but only abnormal results are displayed) Labs Reviewed  RAPID STREP SCREEN (NOT AT [facility])  CULTURE, GROUP A STREP [facility]    EKG  EKG Interpretation None       Radiology Dg Chest 2 View  Result Date: 06/05/2016 CLINICAL DATA:  Cough for 3 days.  Sore throat  and fever. EXAM: CHEST  2 VIEW COMPARISON:  None. FINDINGS: The heart size and mediastinal contours are within normal limits. Both lungs are clear. No pleural effusion or pneumothorax. The visualized skeletal structures are unremarkable. IMPRESSION: No active cardiopulmonary disease. Electronically Signed   By: Amie Portlandavid  Ormond M.D.   On: 06/05/2016 13:14    Procedures Procedures (including critical care time)  Medications Ordered in ED Medications - No data to display   Initial Impression / Assessment and Plan / ED Course  I have reviewed the triage vital signs and the nursing notes.  Pertinent labs & imaging results that were available during my care of the patient were reviewed by me and considered in my medical decision making (see chart for details).  Clinical Course     Pt symptoms consistent with URI. CXR negative for acute infiltrate. Rapid strep obtained in triage is negative. Will send for GAS culture. Pt will be discharged with symptomatic treatment.  Discussed return precautions.  Pt is hemodynamically stable, no hypoxia or tachypnea, overall nontoxic appearing & in NAD prior to discharge.   Final Clinical Impressions(s) / ED Diagnoses   Final diagnoses:  Upper respiratory tract infection, unspecified type  Cough  Viral syndrome    New Prescriptions New Prescriptions   FLUTICASONE (FLONASE) 50 MCG/ACT NASAL SPRAY    Place 2 sprays into both nostrils daily.   IBUPROFEN (ADVIL,MOTRIN) 600 MG TABLET    Take 1 tablet (600 mg total) by mouth every 6 (six) hours as needed.   PROMETHAZINE-DEXTROMETHORPHAN (PROMETHAZINE-DM) 6.25-15 MG/5ML SYRUP    Take 5 mLs by mouth 4 (four) times daily as needed for cough.   I personally performed the services described in this documentation, which was scribed in my presence. The recorded information has been reviewed and is accurate.    Carlene CoriaSerena Y Marks Scalera, PA-C 06/05/16 1456    Bethann BerkshireJoseph Zammit, MD 06/07/16 [phone]

## 2016-06-05 NOTE — Discharge Instructions (Signed)
Your chest x-ray and strep test were negative. I will send your throat swab for culture. You likely have a virus. Take medication as prescribed as needed. Follow up with your primary care provider. Return to the ER for new or worsening symptoms.

## 2016-06-08 LAB — CULTURE, GROUP A STREP (THRC)

## 2017-05-21 ENCOUNTER — Other Ambulatory Visit: Payer: Self-pay

## 2017-05-21 ENCOUNTER — Encounter (HOSPITAL_COMMUNITY): Payer: Self-pay

## 2017-05-21 ENCOUNTER — Inpatient Hospital Stay (HOSPITAL_COMMUNITY)
Admission: AD | Admit: 2017-05-21 | Discharge: 2017-05-21 | Disposition: A | Discharge status: Home / Self Care | Payer: Self-pay | Source: Ambulatory Visit | Attending: Obstetrics and Gynecology | Admitting: Obstetrics and Gynecology

## 2017-05-21 DIAGNOSIS — Z9851 Tubal ligation status: Secondary | ICD-10-CM | POA: Insufficient documentation

## 2017-05-21 DIAGNOSIS — A599 Trichomoniasis, unspecified: Secondary | ICD-10-CM | POA: Insufficient documentation

## 2017-05-21 DIAGNOSIS — F17210 Nicotine dependence, cigarettes, uncomplicated: Secondary | ICD-10-CM | POA: Insufficient documentation

## 2017-05-21 DIAGNOSIS — Z91013 Allergy to seafood: Secondary | ICD-10-CM | POA: Insufficient documentation

## 2017-05-21 LAB — URINALYSIS, ROUTINE W REFLEX MICROSCOPIC
BILIRUBIN URINE: NEGATIVE
GLUCOSE, UA: NEGATIVE mg/dL
Ketones, ur: NEGATIVE mg/dL
Nitrite: NEGATIVE
Protein, ur: NEGATIVE mg/dL
SPECIFIC GRAVITY, URINE: 1.025 (ref 1.005–1.030)
pH: 5 (ref 5.0–8.0)

## 2017-05-21 LAB — POCT PREGNANCY, URINE: Preg Test, Ur: NEGATIVE

## 2017-05-21 LAB — WET PREP, GENITAL
Clue Cells Wet Prep HPF POC: NONE SEEN
SPERM: NONE SEEN
Yeast Wet Prep HPF POC: NONE SEEN

## 2017-05-21 MED ORDER — METRONIDAZOLE 500 MG PO TABS
2000.0000 mg | ORAL_TABLET | Freq: Once | ORAL | Status: AC
  Administered 2017-05-21: 2000 mg via ORAL
  Filled 2017-05-21: qty 4

## 2017-05-21 NOTE — Discharge Instructions (Signed)
In late 2019, the [facility] will be moving to the [facility] campus. At that time, the MAU (Maternity Admissions Unit), where you are being seen today, will no longer see non-pregnant patients. We strongly encourage you to find a doctor's office before that time, so that you can be seen with any GYN concerns, like vaginal discharge, urinary tract infection, etc.. in a timely manner.   In order to make the office visit more convenient, the Center for [facility] at [facility] will be offering evening hours from 4pm-7:30pm on Monday. There will be same-day appointments, walk-in appointments and scheduled appointments available during this time. We will be adding more evening hours over the next year before the move.   Center [facility] @ [facility] [phone]- [phone]  For urgent needs, [facility] is also available for management of urgent GYN complaints such as vaginal discharge or urinary tract infections.    Trichomonas Test Why am I having this test? The trichomonas test is done to diagnose trichomoniasis, an infection caused by an organism called Trichomonas. Trichomoniasis is a sexually transmitted infection (STI). In women, it causes vaginal infections. In men, it can cause the tube that carries urine (urethra) to become inflamed (urethritis). You may have this test as a part of a routine screening for STIs or if you have symptoms of trichomoniasis. To perform the test, your health care provider will take a sample of discharge. The sample is taken from the vagina or cervix in women and from the urethra in men. A urine sample can also be used for testing. What do the results mean? It is your responsibility to obtain your test results. Ask the lab or department performing the test when and how you will get your results. Contact your health care provider to discuss any questions you have about your results. Negative test results A negative test means you  do not have trichomoniasis. Follow your health care provider's directions about any follow-up testing. Positive test results A positive test result means you have an active infection that needs to be treated with antibiotic medicine. All your current sexual partners must also be treated or it is likely you will get reinfected. If your test is positive, your health care provider will start you on medicine and may advise you to:  Not have sexual intercourse until your infection has cleared up.  Use a latex condom properly every time you have sexual intercourse.  Limit the number of sexual partners you have. The more partners you have, the greater your risk of contracting trichomoniasis or another STI.  Tell all sexual partners about your infection so they can also be treated and to prevent reinfection.  Talk with your health care provider to discuss your results, treatment options, and if necessary, the need for more tests. Talk with your health care provider if you have any questions about your results. This information is not intended to replace advice given to you by your health care provider. Make sure you discuss any questions you have with your health care provider. Document Released: 07/20/2004 Document Revised: 01/17/2016 Document Reviewed: 06/29/2013 Elsevier Interactive Patient Education  2017 [employer]

## 2017-05-21 NOTE — MAU Note (Signed)
Having a d/c for about a wk.  White with an odor, today started having some burning.

## 2017-05-21 NOTE — MAU Provider Note (Signed)
History     CSN: [id number]  Arrival date and time: 05/21/17 1304   First Provider Initiated Contact with Patient 05/21/17 1408      Chief Complaint  Patient presents with  . Vaginal Discharge   HPI  [name] is a 41 y.o. female non pregnant here in MAU with complaints of an odorous vaginal discharge. She states she did have a sexual partner that she is no longer with.  States she tried over the counter 1 day monistat which did not work. The discharge is described as white vaginal discharge with odor X 2 weeks.   OB History    Gravida Para Term Preterm AB Living   3 3 2 1  0 3   SAB TAB Ectopic Multiple Live Births   0 0 0 0        Past Medical History:  Diagnosis Date  . Medical history non-contributory     Past Surgical History:  Procedure Laterality Date  . TUBAL LIGATION      History reviewed. No pertinent family history.  Social History   Tobacco Use  . Smoking status: Current Every Day Smoker    Packs/day: 0.50    Types: Cigarettes  . Smokeless tobacco: Never Used  Substance Use Topics  . Alcohol use: Yes    Comment: occasionally   . Drug use: No    Allergies:  Allergies  Allergen Reactions  . Shellfish Allergy Rash    Medications Prior to Admission  Medication Sig Dispense Refill Last Dose  . fluconazole (DIFLUCAN) 150 MG tablet Take 1 tablet (150 mg total) by mouth daily. 1 tablet 0   . fluticasone (FLONASE) 50 MCG/ACT nasal spray Place 2 sprays into both nostrils daily. 16 g 0   . ibuprofen (ADVIL,MOTRIN) 600 MG tablet Take 1 tablet (600 mg total) by mouth every 6 (six) hours as needed. 30 tablet 0   . promethazine-dextromethorphan (PROMETHAZINE-DM) 6.25-15 MG/5ML syrup Take 5 mLs by mouth 4 (four) times daily as needed for cough. 118 mL 0    Recent Results (from the past 2160 hour(s))  Urinalysis, Routine w reflex microscopic     Status: Abnormal   Collection Time: 05/21/17  1:29 PM  Result Value Ref Range   Color, Urine YELLOW  YELLOW   APPearance CLOUDY (A) CLEAR   Specific Gravity, Urine 1.025 1.005 - 1.030   pH 5.0 5.0 - 8.0   Glucose, UA NEGATIVE NEGATIVE mg/dL   Hgb urine dipstick MODERATE (A) NEGATIVE   Bilirubin Urine NEGATIVE NEGATIVE   Ketones, ur NEGATIVE NEGATIVE mg/dL   Protein, ur NEGATIVE NEGATIVE mg/dL   Nitrite NEGATIVE NEGATIVE   Leukocytes, UA LARGE (A) NEGATIVE   RBC / HPF 6-30 0 - 5 RBC/hpf   WBC, UA TOO NUMEROUS TO COUNT 0 - 5 WBC/hpf   Bacteria, UA RARE (A) NONE SEEN   Squamous Epithelial / LPF 6-30 (A) NONE SEEN   Mucus PRESENT   Pregnancy, urine POC     Status: None   Collection Time: 05/21/17  1:47 PM  Result Value Ref Range   Preg Test, Ur NEGATIVE NEGATIVE    Comment:        THE SENSITIVITY OF THIS METHODOLOGY IS >24 mIU/mL   Wet prep, genital     Status: Abnormal   Collection Time: 05/21/17  2:13 PM  Result Value Ref Range   Yeast Wet Prep HPF POC NONE SEEN NONE SEEN   Trich, Wet Prep PRESENT (A) NONE SEEN  Clue Cells Wet Prep HPF POC NONE SEEN NONE SEEN   WBC, Wet Prep HPF POC FEW (A) NONE SEEN    Comment: MANY BACTERIA SEEN   Sperm NONE SEEN     Review of Systems  Constitutional: Negative for fever.  Genitourinary: Positive for vaginal bleeding and vaginal discharge. Negative for dysuria, flank pain, frequency, urgency and vaginal pain.   Physical Exam   Blood pressure 133/78, pulse 73, temperature 98.1 F (36.7 C), temperature source Oral, resp. rate 16, weight 188 lb 12 oz (85.6 kg), last menstrual period 05/14/2017, SpO2 100 %.  Physical Exam  Constitutional: She is oriented to person, place, and time. She appears well-developed and well-nourished. No distress.  HENT:  Head: Normocephalic.  Eyes: Pupils are equal, round, and reactive to light.  Respiratory: Effort normal.  GI: Soft. She exhibits no distension. There is no tenderness. There is no rebound.  Genitourinary:  Genitourinary Comments: Bimanual exam: Cervix closed, No CMT  Uterus non tender,  normal size Adnexa non tender, no masses bilaterally GC/Chlam, wet prep done Chaperone present for exam.   Musculoskeletal: Normal range of motion.  Neurological: She is alert and oriented to person, place, and time.  Skin: Skin is warm. She is not diaphoretic.  Psychiatric: Her behavior is normal.   MAU Course  Procedures  None  MDM  Wet prep & GC HIV + trichomonas 2 grams PO given  Assessment and Plan   A:  1. Trichomonas infection     P:  Discharge home in stable condition  Return to MAU for emergencies only Follow up with GYN or PCP as needed Partner needs treatment, patient no longer in contact with partner.  Condoms only   Duane Lopeasch, Jennifer I, NP 05/21/2017 5:02 PM]

## 2017-05-22 LAB — HIV ANTIBODY (ROUTINE TESTING W REFLEX): HIV SCREEN 4TH GENERATION: NONREACTIVE

## 2017-05-23 LAB — GC/CHLAMYDIA PROBE AMP (~~LOC~~) NOT AT ARMC
CHLAMYDIA, DNA PROBE: NEGATIVE
Neisseria Gonorrhea: NEGATIVE

## 2018-04-15 IMAGING — CR DG CHEST 2V
2 series · 2 of 2 positions shown · non-contrast
Comparison: None.

CLINICAL DATA: Cough for 3 days.  Sore throat and fever.

EXAM:
CHEST  2 VIEW

[w chest pa]
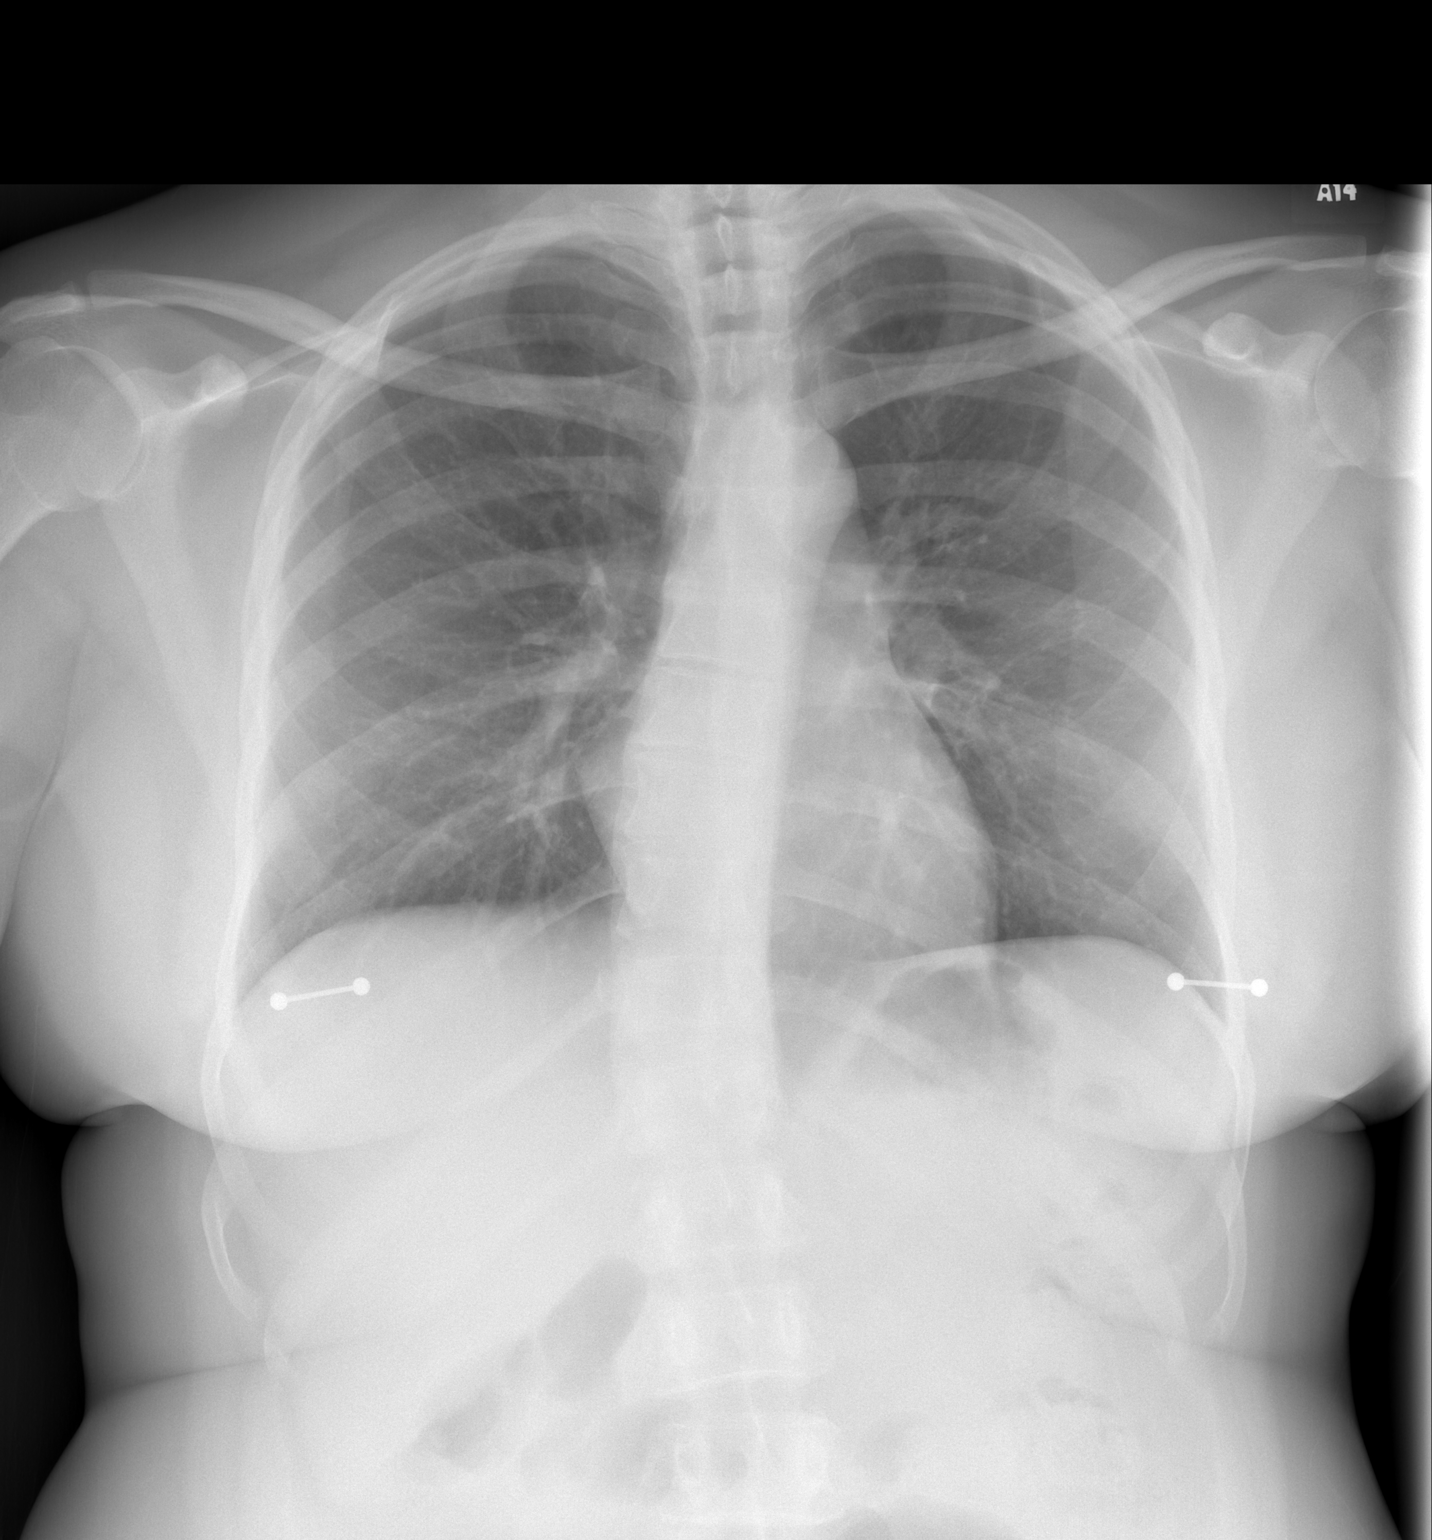

[w chest lat]
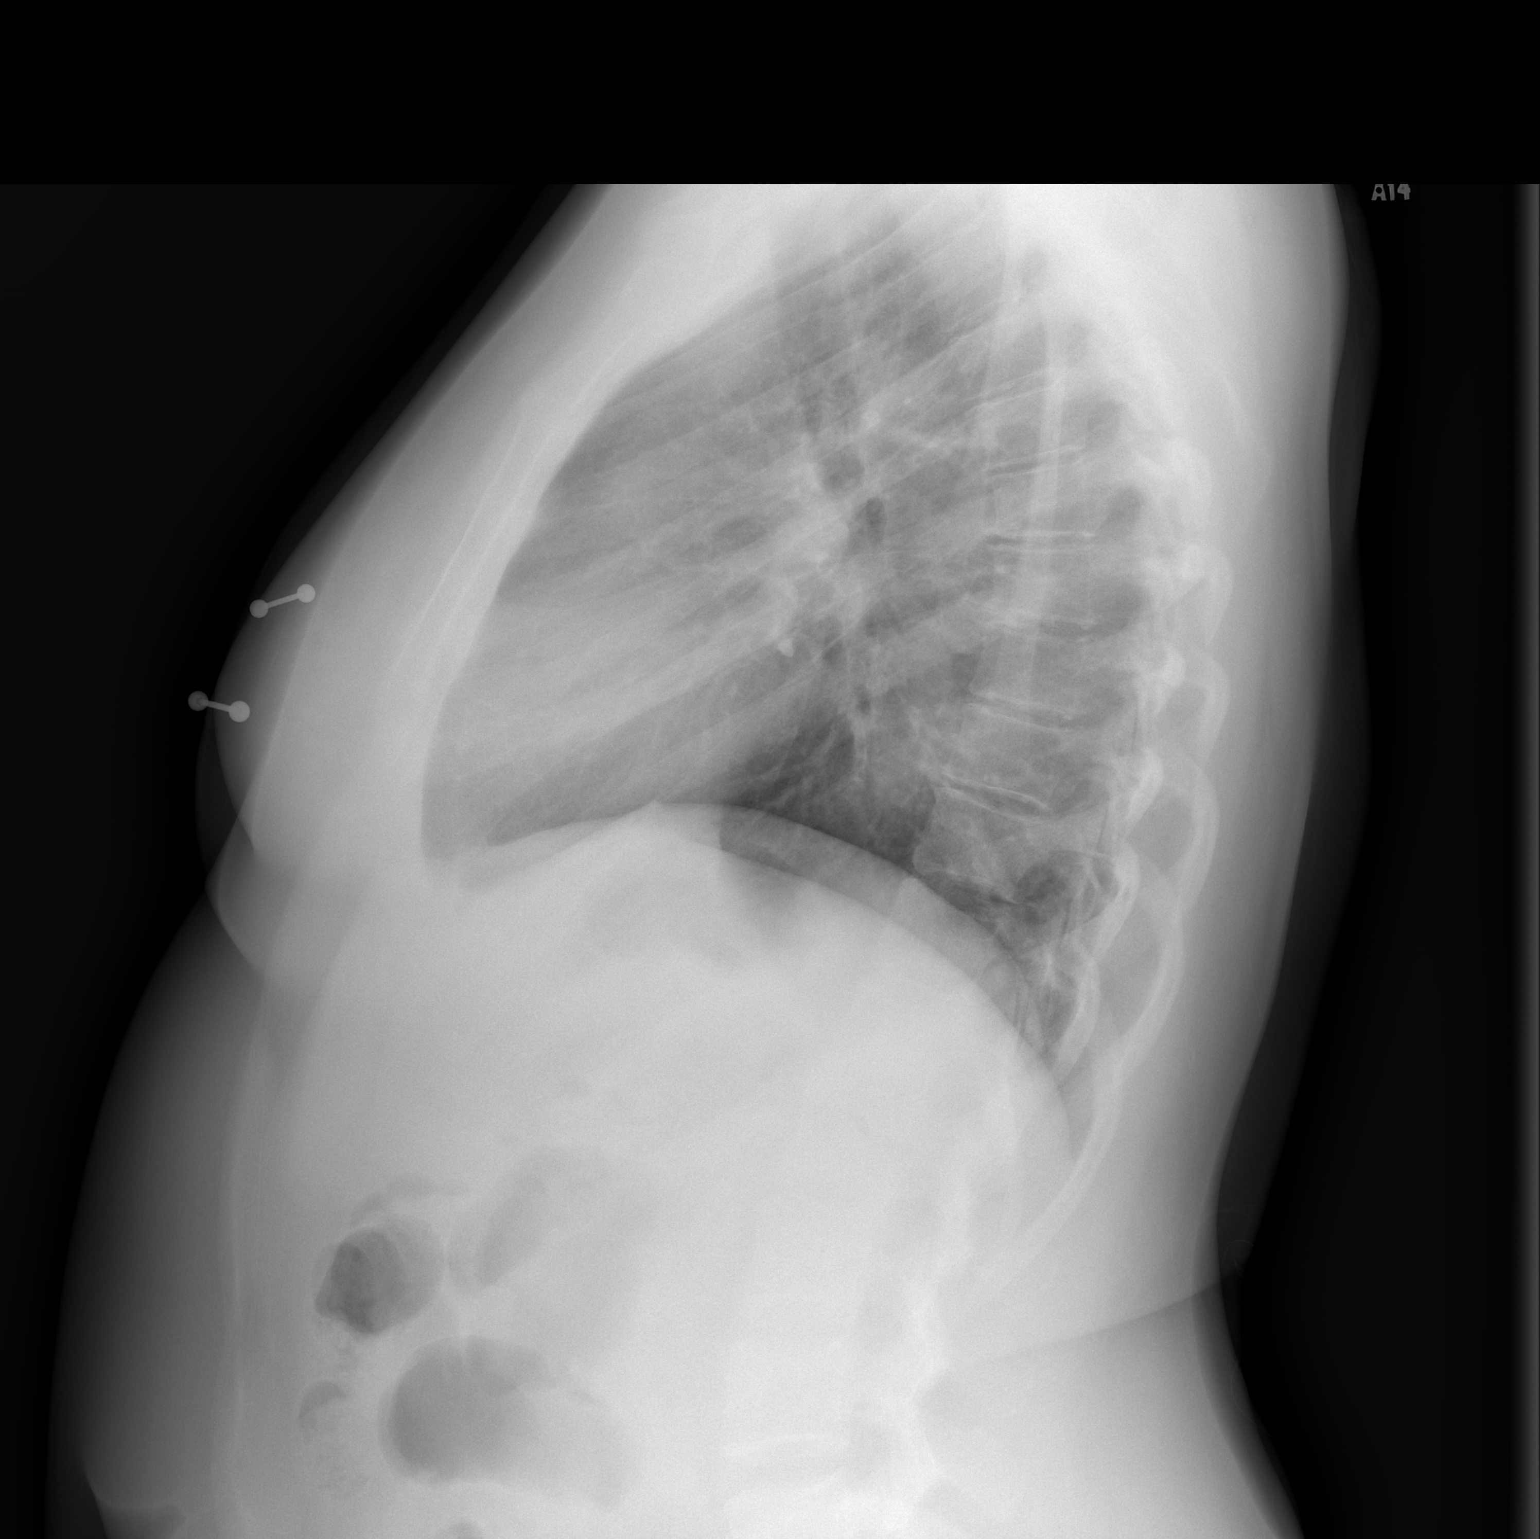

[2 of 2 positions shown; findings below may reference images not displayed]

FINDINGS: The heart size and mediastinal contours are within normal limits.
Both lungs are clear. No pleural effusion or pneumothorax. The
visualized skeletal structures are unremarkable.
IMPRESSION: No active cardiopulmonary disease.

## 2021-03-05 ENCOUNTER — Emergency Department (HOSPITAL_COMMUNITY)
Admission: EM | Admit: 2021-03-05 | Discharge: 2021-03-05 | Disposition: A | Discharge status: Home / Self Care | Payer: 59 | Attending: Emergency Medicine | Admitting: Emergency Medicine

## 2021-03-05 ENCOUNTER — Other Ambulatory Visit: Payer: Self-pay

## 2021-03-05 ENCOUNTER — Encounter (HOSPITAL_COMMUNITY): Payer: Self-pay

## 2021-03-05 DIAGNOSIS — X58XXXA Exposure to other specified factors, initial encounter: Secondary | ICD-10-CM | POA: Insufficient documentation

## 2021-03-05 DIAGNOSIS — F17210 Nicotine dependence, cigarettes, uncomplicated: Secondary | ICD-10-CM | POA: Diagnosis not present

## 2021-03-05 DIAGNOSIS — S0592XA Unspecified injury of left eye and orbit, initial encounter: Secondary | ICD-10-CM | POA: Diagnosis present

## 2021-03-05 DIAGNOSIS — S0502XA Injury of conjunctiva and corneal abrasion without foreign body, left eye, initial encounter: Secondary | ICD-10-CM

## 2021-03-05 MED ORDER — TETRACAINE HCL 0.5 % OP SOLN
1.0000 [drp] | Freq: Once | OPHTHALMIC | Status: AC
  Administered 2021-03-05: 1 [drp] via OPHTHALMIC
  Filled 2021-03-05: qty 4

## 2021-03-05 MED ORDER — ACETAMINOPHEN 325 MG PO TABS: 650.0000 mg | ORAL_TABLET | Freq: Four times a day (QID) | ORAL | 0 refills | Status: DC | PRN

## 2021-03-05 MED ORDER — IBUPROFEN 600 MG PO TABS: 600.0000 mg | ORAL_TABLET | Freq: Four times a day (QID) | ORAL | 0 refills | Status: DC | PRN

## 2021-03-05 MED ORDER — FLUORESCEIN SODIUM 1 MG OP STRP
1.0000 | ORAL_STRIP | Freq: Once | OPHTHALMIC | Status: AC
  Administered 2021-03-05: 1 via OPHTHALMIC
  Filled 2021-03-05: qty 1

## 2021-03-05 MED ORDER — IBUPROFEN 800 MG PO TABS
800.0000 mg | ORAL_TABLET | Freq: Once | ORAL | Status: AC
  Administered 2021-03-05: 800 mg via ORAL
  Filled 2021-03-05: qty 1

## 2021-03-05 NOTE — ED Provider Notes (Signed)
[facility]-EMERGENCY DEPT Provider Note   CSN: [id number] Arrival date & time: 03/05/21  1135     History Chief Complaint  Patient presents with   Eye Pain   Eye Drainage    Krystal Nixon is a 45 y.o. female.  Pt is a 45 yo female presenting for eye pain. Pt admits to left sided eye pain that occurred 4-5 days ago after removing eye lashes. Went to urgent care her gave her erythromycin ointment with dx of bacterial conjunctivitis. Pt states pain has not improved. Denies drainage.  Denies any vision changes. Denies fever, chills, cough, or nasal congestion.   The history is provided by the patient. No language interpreter was used.  Eye Pain This is a new problem. The current episode started more than 2 days ago. The problem occurs constantly. The problem has not changed since onset.Pertinent negatives include no chest pain and no shortness of breath.      Past Medical History:  Diagnosis Date   Medical history non-contributory     There are no problems to display for this patient.   Past Surgical History:  Procedure Laterality Date   TUBAL LIGATION       OB History     Gravida  3   Para  3   Term  2   Preterm  1   AB  0   Living  3      SAB  0   IAB  0   Ectopic  0   Multiple  0   Live Births              History reviewed. No pertinent family history.  Social History   Tobacco Use   Smoking status: Every Day    Packs/day: 0.50    Types: Cigarettes   Smokeless tobacco: Never  Vaping Use   Vaping Use: Never used  Substance Use Topics   Alcohol use: Yes    Comment: occasionally    Drug use: No    Home Medications Prior to Admission medications   Not on File    Allergies    Shellfish allergy  Review of Systems   Review of Systems  Constitutional:  Negative for chills and fever.  HENT:  Negative for ear pain and sore throat.   Eyes:  Positive for pain. Negative for discharge and visual disturbance.   Respiratory:  Negative for cough and shortness of breath.   Cardiovascular:  Negative for chest pain and palpitations.  Gastrointestinal:  Negative for vomiting.  All other systems reviewed and are negative.  Physical Exam Updated Vital Signs BP (!) 153/87 (BP Location: Right Arm)   Pulse 61   Temp 98.6 F (37 C) (Oral)   Resp 17   Ht 5\' 7"  (1.702 m)   Wt 77.8 kg   LMP 02/28/2021 (Approximate)   SpO2 100%   BMI 26.88 kg/m   Physical Exam Vitals and nursing note reviewed.  Constitutional:      Appearance: Normal appearance.  HENT:     Head: Normocephalic and atraumatic.  Eyes:     General: Lids are normal. Vision grossly intact.     Conjunctiva/sclera:     Right eye: Right conjunctiva is not injected.     Left eye: Left conjunctiva is injected. No chemosis, exudate or hemorrhage.    Pupils: Pupils are equal, round, and reactive to light.     Left eye: Fluorescein uptake present.     Visual Fields: Right  eye visual fields normal and left eye visual fields normal.   Cardiovascular:     Rate and Rhythm: Normal rate and regular rhythm.  Pulmonary:     Effort: Pulmonary effort is normal. No respiratory distress.  Skin:    Capillary Refill: Capillary refill takes less than 2 seconds.  Neurological:     General: No focal deficit present.     Mental Status: She is alert and oriented to person, place, and time.    ED Results / Procedures / Treatments   Labs (all labs ordered are listed, but only abnormal results are displayed) Labs Reviewed - No data to display  EKG None  Radiology No results found.  Procedures Procedures   Medications Ordered in ED Medications  tetracaine (PONTOCAINE) 0.5 % ophthalmic solution 1 drop (has no administration in time range)  fluorescein ophthalmic strip 1 strip (has no administration in time range)    ED Course  I have reviewed the triage vital signs and the nursing notes.  Pertinent labs & imaging results that were available  during my care of the patient were reviewed by me and considered in my medical decision making (see chart for details).    MDM Rules/Calculators/A&P                         1:46 PM 45 yo female presenting for eye pain. Pt is Aox3, PERRLA, intact gross vision, no visual field deficits, no foreign bodies, tetracaine applied and fluorescein uptake to lateral left eye at 4 o'clock position. Patient recommended for tylenol/motrin for discomfort, to continue erythromycin ointment prior to bedtime to prevent infection, and follow up in 5 days with opthalmologist.   Patient in no distress and overall condition improved here in the ED. Detailed discussions were had with the patient regarding current findings, and need for close f/u with PCP or on call doctor. The patient has been instructed to return immediately if the symptoms worsen in any way for re-evaluation. Patient verbalized understanding and is in agreement with current care plan. All questions answered prior to discharge.  Final Clinical Impression(s) / ED Diagnoses Final diagnoses:  Abrasion of left cornea, initial encounter    Rx / DC Orders ED Discharge Orders     None        Franne Forts, DO 03/05/21 1349

## 2021-03-05 NOTE — ED Triage Notes (Signed)
Patient c/o left eye pain and drainage x 2 weeks. Patient states she has been to 2 different UC and prescribed meds that have not helped.  Patient states, "my eye feels gritty and my vision is blurred in that eye."

## 2021-08-24 ENCOUNTER — Emergency Department (HOSPITAL_COMMUNITY)
Admission: EM | Admit: 2021-08-24 | Discharge: 2021-08-24 | Disposition: A | Discharge status: Home / Self Care | Payer: 59 | Attending: Emergency Medicine | Admitting: Emergency Medicine

## 2021-08-24 ENCOUNTER — Other Ambulatory Visit: Payer: Self-pay

## 2021-08-24 ENCOUNTER — Encounter (HOSPITAL_COMMUNITY): Payer: Self-pay | Admitting: *Deleted

## 2021-08-24 DIAGNOSIS — L292 Pruritus vulvae: Secondary | ICD-10-CM | POA: Diagnosis not present

## 2021-08-24 DIAGNOSIS — R350 Frequency of micturition: Secondary | ICD-10-CM | POA: Insufficient documentation

## 2021-08-24 DIAGNOSIS — F17210 Nicotine dependence, cigarettes, uncomplicated: Secondary | ICD-10-CM | POA: Diagnosis not present

## 2021-08-24 DIAGNOSIS — R3915 Urgency of urination: Secondary | ICD-10-CM | POA: Insufficient documentation

## 2021-08-24 DIAGNOSIS — R300 Dysuria: Secondary | ICD-10-CM | POA: Diagnosis present

## 2021-08-24 LAB — URINALYSIS, ROUTINE W REFLEX MICROSCOPIC
Bacteria, UA: NONE SEEN
Bilirubin Urine: NEGATIVE
Glucose, UA: NEGATIVE mg/dL
Ketones, ur: NEGATIVE mg/dL
Nitrite: NEGATIVE
Protein, ur: NEGATIVE mg/dL
Specific Gravity, Urine: 1.024 (ref 1.005–1.030)
pH: 6 (ref 5.0–8.0)

## 2021-08-24 LAB — PREGNANCY, URINE: Preg Test, Ur: NEGATIVE

## 2021-08-24 MED ORDER — FOSFOMYCIN TROMETHAMINE 3 G PO PACK: 3.0000 g | PACK | Freq: Once | ORAL | 0 refills | Status: AC

## 2021-08-24 NOTE — ED Provider Notes (Signed)
[facility]-EMERGENCY DEPT Provider Note: Lowella Dell, MD, FACEP  CSN: [id number] MRN: [id number] ARRIVAL: 08/24/21 at 0040 ROOM: WA17/WA17   CHIEF COMPLAINT  Urinary Frequency   HISTORY OF PRESENT ILLNESS  08/24/21 1:33 AM Krystal Nixon is a 46 y.o. female who has had about a week of urinary urgency, frequency, and burning with urination.  She has been taking Azo without relief.  She denies fevers, chills, flank pain or back pain.  She denies vaginal bleeding or discharge but is having some vaginal itching which is usual at the end of her periods.   History reviewed. No pertinent past medical history.   Past Surgical History:  Procedure Laterality Date   TUBAL LIGATION      No family history on file.  Social History   Tobacco Use   Smoking status: Every Day    Packs/day: 0.50    Types: Cigarettes   Smokeless tobacco: Never  Vaping Use   Vaping Use: Never used  Substance Use Topics   Alcohol use: Yes    Comment: occasionally    Drug use: No    Prior to Admission medications   Medication Sig Start Date End Date Taking? Authorizing Provider  fosfomycin (MONUROL) 3 g PACK Take 3 g by mouth once for 1 dose. 08/24/21 08/24/21 Yes Abuk Selleck, MD    Allergies Shellfish allergy   REVIEW OF SYSTEMS  Negative except as noted here or in the History of Present Illness.   PHYSICAL EXAMINATION  Initial Vital Signs Blood pressure (!) 179/92, pulse 60, temperature 98 F (36.7 C), temperature source Oral, resp. rate 16, height 5\' 7"  (1.702 m), weight 79.4 kg, last menstrual period 08/12/2021, SpO2 100 %.  Examination General: Well-developed, well-nourished female in no acute distress; appearance consistent with age of record HENT: normocephalic; atraumatic Eyes: Normal appearance Neck: supple Heart: regular rate and rhythm Lungs: clear to auscultation bilaterally Abdomen: soft; nondistended; pressure on bladder reproduces urge to urinate; bowel sounds present GU: No CVA  tenderness Extremities: No deformity; full range of motion; pulses normal Neurologic: Awake, alert and oriented; motor function intact in all extremities and symmetric; no facial droop Skin: Warm and dry Psychiatric: Normal mood and affect   RESULTS  Summary of this visit's results, reviewed and interpreted by myself:   EKG Interpretation  Date/Time:    Ventricular Rate:    PR Interval:    QRS Duration:   QT Interval:    QTC Calculation:   R Axis:     Text Interpretation:         Laboratory Studies: Results for orders placed or performed during the hospital encounter of 08/24/21 (from the past 24 hour(s))  Urinalysis, Routine w reflex microscopic Urine, Clean Catch     Status: Abnormal   Collection Time: 08/24/21 12:57 AM  Result Value Ref Range   Color, Urine YELLOW YELLOW   APPearance HAZY (A) CLEAR   Specific Gravity, Urine 1.024 1.005 - 1.030   pH 6.0 5.0 - 8.0   Glucose, UA NEGATIVE NEGATIVE mg/dL   Hgb urine dipstick MODERATE (A) NEGATIVE   Bilirubin Urine NEGATIVE NEGATIVE   Ketones, ur NEGATIVE NEGATIVE mg/dL   Protein, ur NEGATIVE NEGATIVE mg/dL   Nitrite NEGATIVE NEGATIVE   Leukocytes,Ua TRACE (A) NEGATIVE   RBC / HPF 21-50 0 - 5 RBC/hpf   WBC, UA 0-5 0 - 5 WBC/hpf   Bacteria, UA NONE SEEN NONE SEEN   Squamous Epithelial / LPF 6-10 0 - 5   Mucus  PRESENT   Pregnancy, urine     Status: None   Collection Time: 08/24/21 12:57 AM  Result Value Ref Range   Preg Test, Ur NEGATIVE NEGATIVE   Imaging Studies: No results found.  ED COURSE and MDM  Nursing notes, initial and subsequent vitals signs, including pulse oximetry, reviewed and interpreted by myself.  Vitals:   08/24/21 0047  BP: (!) 179/92  Pulse: 60  Resp: 16  Temp: 98 F (36.7 C)  TempSrc: Oral  SpO2: 100%  Weight: 79.4 kg  Height: 5\' 7"  (1.702 m)   Medications - No data to display  Symptomatology consistent with urinary tract infection.  Urinalysis is equivocal for UTI and urine has  been sent for culture.  We will treat with a single dose of fosfomycin in the ED pending culture results.  PROCEDURES  Procedures   ED DIAGNOSES     ICD-10-CM   1. Dysuria  R30.0          Sammi Stolarz, , MD 08/24/21 [phone]

## 2021-08-24 NOTE — ED Triage Notes (Signed)
Urinary urgency, burning, and frequency since last week. Has been taking AZO otc. Denies fevers. Vaginal itching.

## 2021-08-25 LAB — URINE CULTURE: Culture: <10000 — AB

## 2022-09-01 ENCOUNTER — Ambulatory Visit (HOSPITAL_COMMUNITY)
Admission: EM | Admit: 2022-09-01 | Discharge: 2022-09-01 | Disposition: A | Discharge status: Home / Self Care | Payer: 59 | Attending: Emergency Medicine | Admitting: Emergency Medicine

## 2022-09-01 ENCOUNTER — Encounter (HOSPITAL_COMMUNITY): Payer: Self-pay

## 2022-09-01 DIAGNOSIS — H6122 Impacted cerumen, left ear: Secondary | ICD-10-CM

## 2022-09-01 DIAGNOSIS — B349 Viral infection, unspecified: Secondary | ICD-10-CM | POA: Diagnosis not present

## 2022-09-01 LAB — POCT RAPID STREP A, ED / UC: Streptococcus, Group A Screen (Direct): NEGATIVE

## 2022-09-01 MED ORDER — IBUPROFEN 800 MG PO TABS: 800.0000 mg | ORAL_TABLET | Freq: Three times a day (TID) | ORAL | 0 refills | Status: AC

## 2022-09-01 MED ORDER — NYSTATIN 100000 UNIT/ML MT SUSP: 5.0000 mL | Freq: Four times a day (QID) | OROMUCOSAL | 0 refills | Status: AC | PRN

## 2022-09-01 NOTE — ED Provider Notes (Signed)
[place]    CSN: [id number] Arrival date & time: 09/01/22  1458      History   Chief Complaint Chief Complaint  Patient presents with   Sore Throat    HPI Krystal Nixon is a 47 y.o. female.   Patient presents for evaluation of fevers, chills, body aches, left-sided neck gland swelling, left-sided ear pain, sore throat beginning overnight.  No known sick contacts prior.  Has attempted use of TheraFlu, Hall's and additional medication in which she cannot recall.  Painful to swallow causing decreased appetite but tolerating some fluids.  Denies congestion, cough shortness of breath or wheezing.   History reviewed. No pertinent past medical history.  There are no problems to display for this patient.   Past Surgical History:  Procedure Laterality Date   TUBAL LIGATION      OB History     Gravida  3   Para  3   Term  2   Preterm  1   AB  0   Living  3      SAB  0   IAB  0   Ectopic  0   Multiple  0   Live Births               Home Medications    Prior to Admission medications   Not on File    Family History History reviewed. No pertinent family history.  Social History Social History   Tobacco Use   Smoking status: Former    Packs/day: 0.50    Types: Cigarettes   Smokeless tobacco: Never  Vaping Use   Vaping Use: Every day   Substances: Nicotine, Flavoring  Substance Use Topics   Alcohol use: Yes    Comment: occasionally    Drug use: No     Allergies   Shellfish allergy   Review of Systems Review of Systems Defer to HPI    Physical Exam Triage Vital Signs ED Triage Vitals  Enc Vitals Group     BP 09/01/22 1624 (!) 177/112     Pulse Rate 09/01/22 1624 64     Resp 09/01/22 1624 16     Temp 09/01/22 1624 98.6 F (37 C)     Temp Source 09/01/22 1624 Oral     SpO2 09/01/22 1624 99 %     Weight 09/01/22 1624 175 lb 0.7 oz (79.4 kg)     Height 09/01/22 1624 '5\' 7"'$  (1.702 m)     Head Circumference --       Peak Flow --      Pain Score 09/01/22 1622 8     Pain Loc --      Pain Edu? --      Excl. in [place]? --    No data found.  Updated Vital Signs BP (!) 177/112 (BP Location: Left Arm)   Pulse 64   Temp 98.6 F (37 C) (Oral)   Resp 16   Ht '5\' 7"'$  (1.702 m)   Wt 175 lb 0.7 oz (79.4 kg)   LMP 08/15/2022 (Approximate)   SpO2 99%   BMI 27.42 kg/m   Visual Acuity Right Eye Distance:   Left Eye Distance:   Bilateral Distance:    Right Eye Near:   Left Eye Near:    Bilateral Near:     Physical Exam Constitutional:      Appearance: Normal appearance. She is well-developed.  HENT:     Right Ear: Tympanic membrane and ear canal  normal.     Left Ear: There is impacted cerumen.     Mouth/Throat:     Mouth: Mucous membranes are moist.     Pharynx: Posterior oropharyngeal erythema present.     Tonsils: No tonsillar exudate. 0 on the right. 0 on the left.  Eyes:     Extraocular Movements: Extraocular movements intact.  Cardiovascular:     Rate and Rhythm: Normal rate and regular rhythm.     Pulses: Normal pulses.     Heart sounds: Normal heart sounds.  Pulmonary:     Effort: Pulmonary effort is normal.     Breath sounds: Normal breath sounds.  Musculoskeletal:     Cervical back: Normal range of motion and neck supple.  Skin:    General: Skin is warm.  Neurological:     General: No focal deficit present.     Mental Status: She is alert and oriented to person, place, and time.  Psychiatric:        Mood and Affect: Mood normal.        Behavior: Behavior normal.      UC Treatments / Results  Labs (all labs ordered are listed, but only abnormal results are displayed) Labs Reviewed  POCT RAPID STREP A, ED / UC    EKG   Radiology No results found.  Procedures Procedures (including critical care time)  Medications Ordered in UC Medications - No data to display  Initial Impression / Assessment and Plan / UC Course  I have reviewed the triage vital signs and the  nursing notes.  Pertinent labs & imaging results that were available during my care of the patient were reviewed by me and considered in my medical decision making (see chart for details).  Viral illness  Patient is in no signs of distress nor toxic appearing.  Vital signs are stable.  Low suspicion for pneumonia, pneumothorax or bronchitis and therefore will defer imaging.  Rapid strep test is negative.  Cerumen impaction noted to the left side, irrigation completed by nursing staff, successful, endorses improvement in discomfort reevaluation, prescribed ibuprofen and Magic mouthwash. May use additional over-the-counter medications as needed for supportive care.  May follow-up with urgent care as needed if symptoms persist or worsen.  Note given.    Final Clinical Impressions(s) / UC Diagnoses   Final diagnoses:  None   Discharge Instructions   None    ED Prescriptions   None    PDMP not reviewed this encounter.   Hans Eden, [place] 09/04/22 [phone]

## 2022-09-01 NOTE — Discharge Instructions (Addendum)
Your symptoms today are most likely being caused by a virus and should steadily improve in time it can take up to 7 to 10 days before you truly start to see a turnaround however things will get better  Strep test is negative for bacteria to the throat, you may gargle and spit Magic mouthwash solution every 4-6 hours to provide temporary relief  On exam your left ear is clogged with wax and could be contributing to your pain therefore has been cleaned by her nursing staff through irrigation    You can take Tylenol and/or Ibuprofen as needed for fever reduction and pain relief.   For cough: honey 1/2 to 1 teaspoon (you can dilute the honey in water or another fluid).  You can also use guaifenesin and dextromethorphan for cough. You can use a humidifier for chest congestion and cough.  If you don't have a humidifier, you can sit in the bathroom with the hot shower running.      For sore throat: try warm salt water gargles, cepacol lozenges, throat spray, warm tea or water with lemon/honey, popsicles or ice, or OTC cold relief medicine for throat discomfort.   For congestion: take a daily anti-histamine like Zyrtec, Claritin, and a oral decongestant, such as pseudoephedrine.  You can also use Flonase 1-2 sprays in each nostril daily.   It is important to stay hydrated: drink plenty of fluids (water, gatorade/powerade/pedialyte, juices, or teas) to keep your throat moisturized and help further relieve irritation/discomfort.

## 2022-09-01 NOTE — ED Triage Notes (Signed)
Chief Complaint: sore throat, swollen lymph nodes, ear pain, and headache all on the left   Onset: yesterday   Prescriptions or OTC medications tried: Yes- halls, Theraflu, throat comfort otc     with little relief  Sick exposure: No  New foods, medications, or products: No  Recent Travel: No
